# Patient Record
Sex: Female | Born: 1970 | Race: Black or African American | Hispanic: No | Marital: Single | Smoking: Never smoker
Health system: Southern US, Community
[De-identification: ages and names within clinical notes are randomized; demographics above are authoritative.]

## PROBLEM LIST (undated history)

## (undated) HISTORY — PX: ABDOMINAL HYSTERECTOMY: SHX81

---

## 2005-08-07 ENCOUNTER — Emergency Department: Payer: Self-pay | Admitting: Emergency Medicine

## 2007-01-17 ENCOUNTER — Emergency Department: Payer: Self-pay | Admitting: Emergency Medicine

## 2007-01-17 ENCOUNTER — Other Ambulatory Visit: Payer: Self-pay

## 2008-01-28 ENCOUNTER — Emergency Department: Payer: Self-pay | Admitting: Emergency Medicine

## 2008-03-19 ENCOUNTER — Emergency Department: Payer: Self-pay | Admitting: Emergency Medicine

## 2008-12-19 ENCOUNTER — Emergency Department: Payer: Self-pay | Admitting: Emergency Medicine

## 2009-10-30 ENCOUNTER — Emergency Department: Payer: Self-pay | Admitting: Internal Medicine

## 2010-03-22 ENCOUNTER — Emergency Department: Payer: Self-pay | Admitting: Unknown Physician Specialty

## 2014-01-22 ENCOUNTER — Emergency Department: Payer: Self-pay | Admitting: Emergency Medicine

## 2014-01-22 LAB — URINALYSIS, COMPLETE
Bilirubin,UR: NEGATIVE
Glucose,UR: NEGATIVE mg/dL (ref 0–75)
Ketone: NEGATIVE
LEUKOCYTE ESTERASE: NEGATIVE
NITRITE: NEGATIVE
Ph: 6 (ref 4.5–8.0)
RBC,UR: 2141 /HPF (ref 0–5)
Specific Gravity: 1.019 (ref 1.003–1.030)
Squamous Epithelial: 1
WBC UR: 3 /HPF (ref 0–5)

## 2014-01-22 LAB — CBC
HCT: 33 % — AB (ref 35.0–47.0)
HGB: 10.4 g/dL — ABNORMAL LOW (ref 12.0–16.0)
MCH: 23.8 pg — AB (ref 26.0–34.0)
MCHC: 31.6 g/dL — ABNORMAL LOW (ref 32.0–36.0)
MCV: 75 fL — ABNORMAL LOW (ref 80–100)
PLATELETS: 266 10*3/uL (ref 150–440)
RBC: 4.37 10*6/uL (ref 3.80–5.20)
RDW: 17.2 % — ABNORMAL HIGH (ref 11.5–14.5)
WBC: 9.6 10*3/uL (ref 3.6–11.0)

## 2014-01-22 LAB — COMPREHENSIVE METABOLIC PANEL
ALBUMIN: 3.6 g/dL (ref 3.4–5.0)
AST: 19 U/L (ref 15–37)
Alkaline Phosphatase: 82 U/L
Anion Gap: 8 (ref 7–16)
BUN: 17 mg/dL (ref 7–18)
Bilirubin,Total: 0.2 mg/dL (ref 0.2–1.0)
CALCIUM: 9.2 mg/dL (ref 8.5–10.1)
CREATININE: 0.59 mg/dL — AB (ref 0.60–1.30)
Chloride: 107 mmol/L (ref 98–107)
Co2: 25 mmol/L (ref 21–32)
EGFR (African American): >60
EGFR (Non-African Amer.): >60
Glucose: 83 mg/dL (ref 65–99)
Osmolality: 280 (ref 275–301)
POTASSIUM: 4 mmol/L (ref 3.5–5.1)
SGPT (ALT): 23 U/L (ref 12–78)
SODIUM: 140 mmol/L (ref 136–145)
TOTAL PROTEIN: 7.6 g/dL (ref 6.4–8.2)

## 2014-01-22 LAB — HCG, QUANTITATIVE, PREGNANCY

## 2014-01-22 LAB — WET PREP, GENITAL

## 2014-01-23 LAB — GC/CHLAMYDIA PROBE AMP

## 2014-04-15 ENCOUNTER — Ambulatory Visit: Payer: Self-pay | Admitting: Obstetrics and Gynecology

## 2014-04-15 LAB — CBC
HCT: 34.5 % — ABNORMAL LOW (ref 35.0–47.0)
HGB: 11.1 g/dL — ABNORMAL LOW (ref 12.0–16.0)
MCH: 24.5 pg — AB (ref 26.0–34.0)
MCHC: 32.1 g/dL (ref 32.0–36.0)
MCV: 76 fL — AB (ref 80–100)
Platelet: 261 10*3/uL (ref 150–440)
RBC: 4.53 10*6/uL (ref 3.80–5.20)
RDW: 17.7 % — ABNORMAL HIGH (ref 11.5–14.5)
WBC: 7.1 10*3/uL (ref 3.6–11.0)

## 2014-05-11 ENCOUNTER — Inpatient Hospital Stay: Payer: Self-pay | Admitting: Obstetrics and Gynecology

## 2014-05-12 LAB — HEMOGLOBIN: HGB: 10.1 g/dL — AB (ref 12.0–16.0)

## 2014-05-13 LAB — PATHOLOGY REPORT

## 2015-01-16 NOTE — Op Note (Signed)
PATIENT NAME:  Deanna Arellano, Deanna Arellano MR#:  [id number] DATE OF BIRTH:  1970/11/14  DATE OF PROCEDURE:  05/11/2014  PREOPERATIVE DIAGNOSES: 1.  Menorrhagia.  2.  Iron deficiency anemia.  3.  Dysmenorrhea.  4.  Uterine fibroid.   POSTOPERATIVE DIAGNOSES: 1.  Menorrhagia.  2.  Iron deficiency anemia.  3.  Dysmenorrhea.  4.  Uterine fibroid.  5.  Pelvic adhesive disease.   OPERATIVE PROCEDURE: Total abdominal hysterectomy, bilateral salpingo-oophorectomy with adhesiolysis.   SURGEON: Sharon Seller, MD  FIRST ASSISTANT: Dr. Valentino Saxon  ANESTHESIA: General endotracheal.   INDICATIONS: The patient is a 44 year old African American female, para 2-0-1-2, status post BTL for contraception, who presents for surgical management of menorrhagia to anemia and dysmenorrhea. The patient has known uterine fibroid on pelvic ultrasound.   FINDINGS AT SURGERY: Revealed a grossly normal-appearing uterus; small fibroid was identified. Filmy adhesions were noted at the bladder flap as well as is in the left adnexa; these adhesions had to be lysed. The left tube contained a hydrosalpinx. Right ovary was notable for a simple ovarian cyst.   DESCRIPTION OF PROCEDURE: The patient was brought to the operating room where she was placed in the supine position. General endotracheal anesthesia was induced without difficulty. A Betadine and ChloraPrep abdominal, perineal, intravaginal prep and drape was performed in standard fashion. Foley catheter was placed and was draining clear yellow urine. After timeout, the laparotomy was performed with Pfannenstiel incision being made in the lower abdomen. The fascia was incised transversely and extended bilaterally with Mayo scissors. The rectus muscle was dissected off the fascia through sharp and blunt dissection. Midline raphe was identified and separated and the peritoneum was entered. A Balfour retractor was used to facilitate exposure. Wet laps were used to pack off the bowel. The  above-noted findings were identified. Adhesiolysis was performed over the lower uterine segment and in the left adnexal region in order to restore normal anatomy. This was done with Metzenbaum as well as Bovie cautery. Once anatomy was restored, the hysterectomy was performed in standard fashion. Round ligaments were clamped, cut and stick tied using 0 Vicryl suture. The anterior and posterior leafs of the broad ligament were opened. The bladder flap was created over the lower uterine segment through sharp dissection. The utero-ovarian ligaments were isolated and doubly clamped and cut. These were stick tied using 0 Vicryl suture. The cardinal broad ligament complexes were then dissected to expose the uterine arteries. Sequentially, the cardinal broad ligament complexes were clamped, cut and stick tied using 0 Vicryl suture. This procedure was carried out down to the level of the cervicovaginal junction. At this point, the cervix was crossclamped with curved Heaney clamps and the specimen was removed from the operative field. The angles of the vagina were closed using Richardson stitches of 0 Vicryl suture. The intervening vaginal cuff was closed using a 0 Vicryl suture in a figure-of-eight manner. The pelvis was copiously irrigated. The fallopian tubes were then removed with the tubes being cross-clamped with curved Heaney clamp following excision with Metzenbaum scissors. The pedicles were then tied off using 0 Vicryl suture. The left hydrosalpinx had to be mobilized through sharp dissection. This was then crossclamped, cut and stick tied similarly. Good hemostasis was obtained. Further inspection of the pelvis revealed minimal light bleeding at the vaginal cuff. Bovie cautery was used to help facilitate hemostasis. Arista was applied to help also promote hemostasis. Once satisfied with hemostasis control, the procedure was terminated with all instrumentation being removed from the abdominopelvic  cavity. The  incision was closed in layers using 0 Maxon on the fascia in a simple running manner. The skin was closed with a subcuticular stitch of 4-0 Vicryl. Dermabond glue was placed over the incision. Pressure dressing was applied. The patient was then mobilized, extubated and taken to the recovery room in satisfactory condition. Estimated blood loss was 150 mL. IV fluids were 1 liter of crystalloid. Urine output was 500 mL of clear urine. The patient did receive Ancef antibiotic prophylaxis. All instrument, needle, and sponge counts were verified as correct.  ____________________________ Prentice DockerMartin A. Kearston Putman, MD mad:sb D: 05/11/2014 10:00:43 ET T: 05/11/2014 11:05:37 ET JOB#: [id number]  cc: Daphine DeutscherMartin A. Blakeley Scheier, MD, <Dictator> Prentice DockerMARTIN A Timothea Bodenheimer MD ELECTRONICALLY SIGNED 05/17/2014 6:56

## 2016-10-23 ENCOUNTER — Encounter: Payer: Self-pay | Admitting: Emergency Medicine

## 2016-10-23 ENCOUNTER — Emergency Department
Admission: EM | Admit: 2016-10-23 | Discharge: 2016-10-23 | Disposition: A | Discharge status: Home / Self Care | Payer: Self-pay | Attending: Student in an Organized Health Care Education/Training Program | Admitting: Student in an Organized Health Care Education/Training Program

## 2016-10-23 ENCOUNTER — Emergency Department: Payer: Self-pay

## 2016-10-23 DIAGNOSIS — J40 Bronchitis, not specified as acute or chronic: Secondary | ICD-10-CM | POA: Insufficient documentation

## 2016-10-23 LAB — URINALYSIS, COMPLETE (UACMP) WITH MICROSCOPIC
BILIRUBIN URINE: NEGATIVE
Glucose, UA: NEGATIVE mg/dL
HGB URINE DIPSTICK: NEGATIVE
KETONES UR: 5 mg/dL — AB
LEUKOCYTES UA: NEGATIVE
NITRITE: NEGATIVE
Protein, ur: NEGATIVE mg/dL
SPECIFIC GRAVITY, URINE: 1.028 (ref 1.005–1.030)
pH: 5 (ref 5.0–8.0)

## 2016-10-23 LAB — INFLUENZA PANEL BY PCR (TYPE A & B)
INFLAPCR: NEGATIVE
Influenza B By PCR: NEGATIVE

## 2016-10-23 MED ORDER — PREDNISONE 10 MG (21) PO TBPK: ORAL_TABLET | ORAL | 0 refills | Status: DC

## 2016-10-23 MED ORDER — IBUPROFEN 600 MG PO TABS
600.0000 mg | ORAL_TABLET | Freq: Once | ORAL | Status: AC
  Administered 2016-10-23: 600 mg via ORAL

## 2016-10-23 MED ORDER — AZITHROMYCIN 250 MG PO TABS: ORAL_TABLET | ORAL | 0 refills | Status: DC

## 2016-10-23 MED ORDER — IBUPROFEN 400 MG PO TABS
ORAL_TABLET | ORAL | Status: AC
  Administered 2016-10-23: 600 mg via ORAL
  Filled 2016-10-23: qty 2

## 2016-10-23 NOTE — ED Triage Notes (Signed)
Cough, fever and body aches x 1 week.

## 2016-10-23 NOTE — ED Provider Notes (Signed)
[facility] Emergency Department Provider Note  ____________________________________________  Time seen: Approximately 5:29 PM  I have reviewed the triage vital signs and the nursing notes.   HISTORY  Chief Complaint Cough    HPI Deanna Arellano is a 46 y.o. female emergency department with one week of muscle aches, productive cough with clear sputum. Patient states that she coughed so much that she urinates. Patient is unsure of fever. Patient has not taken anything for symptoms. Patient did not receive flu shot this year.Patient denies headache, sinus congestion, sore throat, shortness of breath, chest pain, nausea, vomiting.   History reviewed. No pertinent past medical history.  There are no active problems to display for this patient.   Past Surgical History:  Procedure Laterality Date  . ABDOMINAL HYSTERECTOMY      Prior to Admission medications   Medication Sig Start Date End Date Taking? Authorizing Provider  azithromycin (ZITHROMAX Z-PAK) 250 MG tablet Take 2 tablets (500 mg) on  Day 1,  followed by 1 tablet (250 mg) once daily on Days 2 through 5. 10/23/16   Enid Derry, PA-C  predniSONE (STERAPRED UNI-PAK 21 TAB) 10 MG (21) TBPK tablet Take 6 tablets on day 1, take 5 tablets on day 2, take 4 tablets on day 3, take 3 tablets on day 4, take 2 tablets on day 5, take 1 tablet on day 6 10/23/16   Enid Derry, PA-C    Allergies Amoxicillin  No family history on file.  Social History Social History  Substance Use Topics  . Smoking status: Never Smoker  . Smokeless tobacco: Not on file  . Alcohol use Not on file     Review of Systems  Constitutional: No chills Eyes: No visual changes. No discharge. ENT: Negative for congestion and rhinorrhea. Cardiovascular: No chest pain. Respiratory: Positive for cough. No SOB. Gastrointestinal:  No nausea, no vomiting.  No diarrhea.  No constipation. Musculoskeletal: Negative for musculoskeletal  pain. Skin: Negative for rash, abrasions, lacerations, ecchymosis. Neurological: Negative for headaches.   ____________________________________________   PHYSICAL EXAM:  VITAL SIGNS: ED Triage Vitals  Enc Vitals Group     BP 10/23/16 1557 (!) 146/96     Pulse Rate 10/23/16 1557 (!) 112     Resp 10/23/16 1557 20     Temp 10/23/16 1557 (!) 101.6 F (38.7 C)     Temp Source 10/23/16 1557 Oral     SpO2 10/23/16 1557 97 %     Weight 10/23/16 1558 210 lb (95.3 kg)     Height 10/23/16 1558 5\' 7"  (1.702 m)     Head Circumference --      Peak Flow --      Pain Score 10/23/16 1558 10     Pain Loc --      Pain Edu? --      Excl. in GC? --      Constitutional: Alert and oriented. Well appearing and in no acute distress. Eyes: Conjunctivae are normal. PERRL. EOMI. No discharge. Head: Atraumatic. ENT: No frontal and maxillary sinus tenderness.      Ears: Tympanic membranes pearly gray with good landmarks. No discharge.      Nose: Mild congestion/rhinnorhea.      Mouth/Throat: Mucous membranes are moist. Oropharynx non-erythematous. Tonsils not enlarged. No exudates. Uvula midline. Neck: No stridor.   Hematological/Lymphatic/Immunilogical: No cervical lymphadenopathy. Cardiovascular: Normal rate, regular rhythm. Good peripheral circulation. Respiratory: Normal respiratory effort without tachypnea or retractions. Lungs CTAB. Good air entry to the bases  with no decreased or absent breath sounds. Gastrointestinal: Bowel sounds 4 quadrants. Soft and nontender to palpation. No guarding or rigidity. No palpable masses. No distention. Musculoskeletal: Full range of motion to all extremities. No gross deformities appreciated. Neurologic:  Normal speech and language. No gross focal neurologic deficits are appreciated.  Skin:  Skin is warm, dry and intact. No rash noted. Psychiatric: Mood and affect are normal. Speech and behavior are normal. Patient exhibits appropriate insight and  judgement.   ____________________________________________   LABS (all labs ordered are listed, but only abnormal results are displayed)  Labs Reviewed  URINALYSIS, COMPLETE (UACMP) WITH MICROSCOPIC - Abnormal; Notable for the following:       Result Value   Color, Urine YELLOW (*)    APPearance HAZY (*)    Ketones, ur 5 (*)    Bacteria, UA RARE (*)    Squamous Epithelial / LPF 6-30 (*)    All other components within normal limits  INFLUENZA PANEL BY PCR (TYPE A & B)   ____________________________________________  EKG   ____________________________________________  RADIOLOGY Lexine BatonI, Anmol Fleck, personally viewed and evaluated these images (plain radiographs) as part of my medical decision making, as well as reviewing the written report by the radiologist.  Dg Chest 2 View  Result Date: 10/23/2016 CLINICAL DATA:  46 year old female with history of cough, congestion and generalized chest pain. Urinary incontinence for 1 week. EXAM: CHEST  2 VIEW COMPARISON:  Chest x-ray 01/17/2007. FINDINGS: Lung volumes are normal. No consolidative airspace disease. No pleural effusions. No pneumothorax. No pulmonary nodule or mass noted. Pulmonary vasculature and the cardiomediastinal silhouette are within normal limits. IMPRESSION: No radiographic evidence of acute cardiopulmonary disease. Electronically Signed   By: Trudie Reedaniel  Entrikin M.D.   On: 10/23/2016 16:53    ____________________________________________    PROCEDURES  Procedure(s) performed:    Procedures    Medications  ibuprofen (ADVIL,MOTRIN) tablet 600 mg (600 mg Oral Given 10/23/16 1603)     ____________________________________________   INITIAL IMPRESSION / ASSESSMENT AND PLAN / ED COURSE  Pertinent labs & imaging results that were available during my care of the patient were reviewed by me and considered in my medical decision making (see chart for details).  Review of the [place] CSRS was performed in accordance of the  [facility] prior to dispensing any controlled drugs.     Patient's diagnosis is consistent with Bronchitis. Exam is reassuring. Patient's temperature came down to 98.3 before discharge. Heart rate reassessed at 90 bpm. No indication of acute cardiopulmonary processes on x-ray. Influenza negative. No indication of UTI, as rare bacteria present on urinalysis was likely contamination. Patient will be discharged home with prescriptions for azithromycin and prednisone. Patient is to follow up with PCP as needed or otherwise directed. Patient is given ED precautions to return to the ED for any worsening or new symptoms.   ____________________________________________  FINAL CLINICAL IMPRESSION(S) / ED DIAGNOSES  Final diagnoses:  Bronchitis      NEW MEDICATIONS STARTED DURING THIS VISIT:  Discharge Medication List as of 10/23/2016  5:48 PM    START taking these medications   Details  azithromycin (ZITHROMAX Z-PAK) 250 MG tablet Take 2 tablets (500 mg) on  Day 1,  followed by 1 tablet (250 mg) once daily on Days 2 through 5., Print    predniSONE (STERAPRED UNI-PAK 21 TAB) 10 MG (21) TBPK tablet Take 6 tablets on day 1, take 5 tablets on day 2, take 4 tablets on day 3, take 3 tablets on  day 4, take 2 tablets on day 5, take 1 tablet on day 6, Print            This chart was dictated using voice recognition software/Dragon. Despite best efforts to proofread, errors can occur which can change the meaning. Any change was purely unintentional.    Enid Derry, PA-C 10/23/16 1842    Willy Eddy, MD 10/23/16 2019

## 2017-10-25 ENCOUNTER — Other Ambulatory Visit: Payer: Self-pay

## 2017-10-25 ENCOUNTER — Emergency Department
Admission: EM | Admit: 2017-10-25 | Discharge: 2017-10-25 | Disposition: A | Discharge status: Home / Self Care | Payer: Self-pay | Attending: Emergency Medicine | Admitting: Emergency Medicine

## 2017-10-25 DIAGNOSIS — B349 Viral infection, unspecified: Secondary | ICD-10-CM | POA: Insufficient documentation

## 2017-10-25 LAB — INFLUENZA PANEL BY PCR (TYPE A & B)
INFLAPCR: NEGATIVE
Influenza B By PCR: NEGATIVE

## 2017-10-25 LAB — GROUP A STREP BY PCR: Group A Strep by PCR: NOT DETECTED

## 2017-10-25 MED ORDER — MAGIC MOUTHWASH W/LIDOCAINE: 5.0000 mL | Freq: Four times a day (QID) | ORAL | 0 refills | Status: DC

## 2017-10-25 MED ORDER — IBUPROFEN 600 MG PO TABS: 600.0000 mg | ORAL_TABLET | Freq: Three times a day (TID) | ORAL | 0 refills | Status: DC | PRN

## 2017-10-25 MED ORDER — PSEUDOEPH-BROMPHEN-DM 30-2-10 MG/5ML PO SYRP: 5.0000 mL | ORAL_SOLUTION | Freq: Four times a day (QID) | ORAL | 0 refills | Status: DC | PRN

## 2017-10-25 MED ORDER — KETOROLAC TROMETHAMINE 60 MG/2ML IM SOLN
60.0000 mg | Freq: Once | INTRAMUSCULAR | Status: AC
  Administered 2017-10-25: 60 mg via INTRAMUSCULAR
  Filled 2017-10-25: qty 2

## 2017-10-25 NOTE — ED Notes (Signed)
See triage note.  States she developed chills and body aches for about 1 week  Afebrile on arrival.states she has been taking OTC meds w/o relief

## 2017-10-25 NOTE — ED Triage Notes (Signed)
Pt report body aches, denies N/V, c/o sore throat, runny nose, post nasal drip - pt has been sick for 1 week

## 2017-10-25 NOTE — ED Provider Notes (Signed)
[facility] Emergency Department Provider Note   ____________________________________________   First MD Initiated Contact with Patient 10/25/17 1453     (approximate)  I have reviewed the triage vital signs and the nursing notes.   HISTORY  Chief Complaint URI    HPI Deanna Arellano is a 47 y.o. female patient complained of body aches, sore throat, intermittent nasal congestion and runny nose and a postnasal drainage.  Patient denies nausea, vomiting, or diarrhea.  Patient complaint for 1 week but is increased in the past 2 days.  No relief with over-the-counter preparations.  Patient described body aches as "generalized aching". History reviewed. No pertinent past medical history.  There are no active problems to display for this patient.   Past Surgical History:  Procedure Laterality Date  . ABDOMINAL HYSTERECTOMY      Prior to Admission medications   Medication Sig Start Date End Date Taking? Authorizing Provider  azithromycin (ZITHROMAX Z-PAK) 250 MG tablet Take 2 tablets (500 mg) on  Day 1,  followed by 1 tablet (250 mg) once daily on Days 2 through 5. 10/23/16   Enid Derry, PA-C  brompheniramine-pseudoephedrine-DM 30-2-10 MG/5ML syrup Take 5 mLs by mouth 4 (four) times daily as needed. 10/25/17   Joni Reining, PA-C  ibuprofen (ADVIL,MOTRIN) 600 MG tablet Take 1 tablet (600 mg total) by mouth every 8 (eight) hours as needed. 10/25/17   Joni Reining, PA-C  magic mouthwash w/lidocaine SOLN Take 5 mLs by mouth 4 (four) times daily. 10/25/17   Joni Reining, PA-C  predniSONE (STERAPRED UNI-PAK 21 TAB) 10 MG (21) TBPK tablet Take 6 tablets on day 1, take 5 tablets on day 2, take 4 tablets on day 3, take 3 tablets on day 4, take 2 tablets on day 5, take 1 tablet on day 6 10/23/16   Enid Derry, PA-C    Allergies Amoxicillin  No family history on file.  Social History Social History   Tobacco Use  . Smoking status: Never Smoker  .  Smokeless tobacco: Never Used  Substance Use Topics  . Alcohol use: Yes    Comment: occ  . Drug use: No    Review of Systems Constitutional: No fever/chills Eyes: No visual changes. ENT: Sore throat and runny nose Cardiovascular: Denies chest pain. Respiratory: Denies shortness of breath. Gastrointestinal: No abdominal pain.  No nausea, no vomiting.  No diarrhea.  No constipation. Genitourinary: Negative for dysuria. Musculoskeletal: Negative for back pain. Skin: Negative for rash. Neurological: Negative for headaches, focal weakness or numbness. Allergic/Immunilogical: Penicillin ____________________________________________   PHYSICAL EXAM:  VITAL SIGNS: ED Triage Vitals  Enc Vitals Group     BP 10/25/17 1428 130/81     Pulse Rate 10/25/17 1428 64     Resp 10/25/17 1428 16     Temp 10/25/17 1428 97.8 F (36.6 C)     Temp Source 10/25/17 1428 Oral     SpO2 10/25/17 1428 100 %     Weight 10/25/17 1429 250 lb (113.4 kg)     Height 10/25/17 1429 5\' 7"  (1.702 m)     Head Circumference --      Peak Flow --      Pain Score 10/25/17 1429 6     Pain Loc --      Pain Edu? --      Excl. in GC? --     Constitutional: Alert and oriented. Well appearing and in no acute distress. Nose: Edematous nasal turbinates clear rhinorrhea  Mouth/Throat: Mucous membranes are moist.  Oropharynx erythematous.  No exudate Neck: No stridor. Hematological/Lymphatic/Immunilogical: No cervical lymphadenopathy. Cardiovascular: Normal rate, regular rhythm. Grossly normal heart sounds.  Good peripheral circulation. Respiratory: Normal respiratory effort.  No retractions. Lungs CTAB. Neurologic:  Normal speech and language. No gross focal neurologic deficits are appreciated. No gait instability. Skin:  Skin is warm, dry and intact. No rash noted. Psychiatric: Mood and affect are normal. Speech and behavior are normal.  ____________________________________________   LABS (all labs ordered are  listed, but only abnormal results are displayed)  Labs Reviewed  GROUP A STREP BY PCR  INFLUENZA PANEL BY PCR (TYPE A & B)   ____________________________________________  EKG   ____________________________________________  RADIOLOGY  ED MD interpretation:    Official radiology report(s): No results found.  ____________________________________________   PROCEDURES  Procedure(s) performed: None  Procedures  Critical Care performed: No  ____________________________________________   INITIAL IMPRESSION / ASSESSMENT AND PLAN / ED COURSE  As part of my medical decision making, I reviewed the following data within the electronic MEDICAL RECORD NUMBER    Viral illness.  Discussed negative strep and flu results with patient.  Patient given discharge care instructions.  Patient with a work note advised take medication as directed.  Patient [facility].      ____________________________________________   FINAL CLINICAL IMPRESSION(S) / ED DIAGNOSES  Final diagnoses:  Viral syndrome     ED Discharge Orders        Ordered    brompheniramine-pseudoephedrine-DM 30-2-10 MG/5ML syrup  4 times daily PRN     10/25/17 1617    ibuprofen (ADVIL,MOTRIN) 600 MG tablet  Every 8 hours PRN     10/25/17 1617    magic mouthwash w/lidocaine SOLN  4 times daily     10/25/17 1617       Note:  This document was prepared using Dragon voice recognition software and may include unintentional dictation errors.    Joni ReiningSmith, Chaquana Nichols K, PA-C 10/25/17 1622    Phineas SemenGoodman, [place], MD 10/25/17 [phone]

## 2018-05-21 ENCOUNTER — Other Ambulatory Visit: Payer: Self-pay

## 2018-05-21 ENCOUNTER — Ambulatory Visit
Admission: EM | Admit: 2018-05-21 | Discharge: 2018-05-21 | Disposition: A | Discharge status: Home / Self Care | Payer: Self-pay | Attending: Family Medicine | Admitting: Family Medicine

## 2018-05-21 ENCOUNTER — Encounter: Payer: Self-pay | Admitting: Emergency Medicine

## 2018-05-21 DIAGNOSIS — R8290 Unspecified abnormal findings in urine: Secondary | ICD-10-CM

## 2018-05-21 DIAGNOSIS — R1030 Lower abdominal pain, unspecified: Secondary | ICD-10-CM

## 2018-05-21 LAB — URINALYSIS, COMPLETE (UACMP) WITH MICROSCOPIC
BILIRUBIN URINE: NEGATIVE
Glucose, UA: NEGATIVE mg/dL
HGB URINE DIPSTICK: NEGATIVE
Leukocytes, UA: NEGATIVE
NITRITE: NEGATIVE
RBC / HPF: NONE SEEN RBC/hpf (ref 0–5)
Specific Gravity, Urine: 1.015 (ref 1.005–1.030)
pH: 8.5 — ABNORMAL HIGH (ref 5.0–8.0)

## 2018-05-21 MED ORDER — ONDANSETRON HCL 4 MG PO TABS: 4.0000 mg | ORAL_TABLET | Freq: Three times a day (TID) | ORAL | 0 refills | Status: DC | PRN

## 2018-05-21 NOTE — ED Triage Notes (Signed)
Patient c/o dysuria that started over 1 week ago.

## 2018-05-21 NOTE — Discharge Instructions (Signed)
Stay hydrated. Don't hold urine.   Zofran as needed for nausea.  Take care  Dr. Adriana Simasook

## 2018-05-21 NOTE — ED Provider Notes (Signed)
[facility]    CSN: [id number] Arrival date & time: 05/21/18  1627  History   Chief Complaint Chief Complaint  Patient presents with  . Dysuria   HPI  47 year old female presents with concerns that she has UTI.  Patient states that she has had ongoing urinary symptoms for the past 3 months.  Patient reported to the nurse that she had dysuria but denies dysuria with me.  She states that she has some suprapubic discomfort after she urinates.  But no pain when she actually urinates.  No frequency.  Patient often holds her urine while at work.  Denies vaginal discharge.  No concern for UTI.  Patient has had a hysterectomy and is therefore not pregnant.  Patient reports that she has an odor to her urine.  Patient states that it is strong and requires her to spray ear pressure.  No fevers or chills.  No flank pain.  Patient does report that she has had some nausea as of yesterday.  No other associated symptoms.  No other complaints.  PMH, Surgical Hx, Family Hx, Social History reviewed and updated as below.  PMH: Reported Hx of UTI  Past Surgical History:  Procedure Laterality Date  . ABDOMINAL HYSTERECTOMY      OB History   None    Home Medications    Prior to Admission medications   Medication Sig Start Date End Date Taking? Authorizing Provider  ondansetron (ZOFRAN) 4 MG tablet Take 1 tablet (4 mg total) by mouth every 8 (eight) hours as needed for nausea or vomiting. 05/21/18   Tommie Samsook, Tim Corriher G, DO   Family History Family History  Problem Relation Age of Onset  . Asthma Mother   . Hypertension Father    Social History Social History   Tobacco Use  . Smoking status: Never Smoker  . Smokeless tobacco: Never Used  Substance Use Topics  . Alcohol use: Yes    Comment: occ  . Drug use: No   Allergies   Amoxicillin  Review of Systems Review of Systems  Constitutional: Negative.   Gastrointestinal:       Suprapubic pain.   Genitourinary: Negative for  frequency.       Odor.    Physical Exam Triage Vital Signs ED Triage Vitals  Enc Vitals Group     BP 05/21/18 1644 140/77     Pulse Rate 05/21/18 1644 73     Resp 05/21/18 1644 18     Temp 05/21/18 1644 98.1 F (36.7 C)     Temp Source 05/21/18 1644 Oral     SpO2 05/21/18 1644 98 %     Weight 05/21/18 1642 210 lb (95.3 kg)     Height 05/21/18 1642 5\' 7"  (1.702 m)     Head Circumference --      Peak Flow --      Pain Score 05/21/18 1642 0     Pain Loc --      Pain Edu? --      Excl. in GC? --    Updated Vital Signs BP 140/77 (BP Location: Left Arm)   Pulse 73   Temp 98.1 F (36.7 C) (Oral)   Resp 18   Ht 5\' 7"  (1.702 m)   Wt 95.3 kg   SpO2 98%   BMI 32.89 kg/m   Visual Acuity Right Eye Distance:   Left Eye Distance:   Bilateral Distance:    Right Eye Near:   Left Eye Near:    Bilateral  Near:     Physical Exam  Constitutional: She is oriented to person, place, and time. She appears well-developed. No distress.  Cardiovascular: Normal rate and regular rhythm.  Pulmonary/Chest: Effort normal and breath sounds normal.  Abdominal: Soft. She exhibits no distension. There is no tenderness.  Neurological: She is alert and oriented to person, place, and time.  Psychiatric: She has a normal mood and affect. Her behavior is normal.  Nursing note and vitals reviewed.  UC Treatments / Results  Labs (all labs ordered are listed, but only abnormal results are displayed) Labs Reviewed  URINALYSIS, COMPLETE (UACMP) WITH MICROSCOPIC - Abnormal; Notable for the following components:      Result Value   pH 8.5 (*)    Ketones, ur TRACE (*)    Protein, ur TRACE (*)    Bacteria, UA RARE (*)    All other components within normal limits  URINE CULTURE    EKG None  Radiology No results found.  Procedures Procedures (including critical care time)  Medications Ordered in UC Medications - No data to display  Initial Impression / Assessment and Plan / UC Course  I  have reviewed the triage vital signs and the nursing notes.  Pertinent labs & imaging results that were available during my care of the patient were reviewed by me and considered in my medical decision making (see chart for details).    47 year old female presents with urine odor and mild suprapubic pain.  No dysuria.  No frequency.  Urinalysis not consistent with UTI.  Sending culture.  Supportive care.  Zofran as needed for nausea.  Final Clinical Impressions(s) / UC Diagnoses   Final diagnoses:  Abnormal urine odor     Discharge Instructions     Stay hydrated. Don't hold urine.   Zofran as needed for nausea.  Take care  Dr. Adriana Simas    ED Prescriptions    Medication Sig Dispense Auth. Provider   ondansetron (ZOFRAN) 4 MG tablet Take 1 tablet (4 mg total) by mouth every 8 (eight) hours as needed for nausea or vomiting. 20 tablet Tommie Sams, DO     Controlled Substance Prescriptions [place] Controlled Substance Registry consulted? Not Applicable   Tommie Sams, DO 05/21/18 1839

## 2018-05-24 LAB — URINE CULTURE

## 2018-05-27 ENCOUNTER — Telehealth (HOSPITAL_COMMUNITY): Payer: Self-pay

## 2018-05-27 MED ORDER — SULFAMETHOXAZOLE-TRIMETHOPRIM 800-160 MG PO TABS: 1.0000 | ORAL_TABLET | Freq: Two times a day (BID) | ORAL | 0 refills | Status: AC

## 2018-05-27 MED ORDER — FLUCONAZOLE 150 MG PO TABS: 150.0000 mg | ORAL_TABLET | Freq: Every day | ORAL | 0 refills | Status: AC

## 2018-05-27 NOTE — Telephone Encounter (Signed)
Pt called concern for yeast infection from antibiotics. Diflucan 150 mg called into pharmacy of choice.

## 2018-10-03 ENCOUNTER — Other Ambulatory Visit: Payer: Self-pay

## 2018-10-03 ENCOUNTER — Encounter: Payer: Self-pay | Admitting: Emergency Medicine

## 2018-10-03 ENCOUNTER — Ambulatory Visit
Admission: EM | Admit: 2018-10-03 | Discharge: 2018-10-03 | Disposition: A | Discharge status: Home / Self Care | Payer: Self-pay | Attending: Emergency Medicine | Admitting: Emergency Medicine

## 2018-10-03 DIAGNOSIS — J0101 Acute recurrent maxillary sinusitis: Secondary | ICD-10-CM | POA: Insufficient documentation

## 2018-10-03 MED ORDER — DOXYCYCLINE HYCLATE 100 MG PO CAPS: 100.0000 mg | ORAL_CAPSULE | Freq: Two times a day (BID) | ORAL | 0 refills | Status: DC

## 2018-10-03 MED ORDER — FLUTICASONE PROPIONATE 50 MCG/ACT NA SUSP: 1.0000 | Freq: Every day | NASAL | 0 refills | Status: DC

## 2018-10-03 NOTE — Discharge Instructions (Addendum)
Take medication as prescribed. Rest. Drink plenty of fluids. Over the counter mucinex as discussed.  ° °Follow up with your primary care physician this week as needed. Return to Urgent care for new or worsening concerns.  ° °

## 2018-10-03 NOTE — ED Triage Notes (Signed)
Patient c/o nasal congestion x 1 week. Patient states she has a sinus infection. Denies fever. Denies cough. Patient has been taking OTC sinus medication for her symptoms.

## 2018-10-03 NOTE — ED Provider Notes (Signed)
[facility] ____________________________________________  Time seen: Approximately 12:31 PM  I have reviewed the triage vital signs and the nursing notes.   HISTORY  Chief Complaint Nasal Congestion   HPI Deanna Arellano is a 48 y.o. female presenting for evaluation of just over 1 week of nasal congestion, sinus pressure and sinus drainage.  Reports intermittent cough, stating cough is worse at night.  States sinus pressure has been constant for the last few days around her nose and her cheeks.  States thick nasal drainage when blowing her nose as well as postnasal.  States mild sinus pain currently.  Denies sore throat, known fevers or recent sickness.  Denies any current chest pain or shortness of breath.  Has continue to remain active.  Continues to eat and drink well.  Has tried over-the-counter cough and congestion medication without resolution.  Patient states that this feels similar to her past recurrent sinus infections.  Denies recent sinusitis.  Has not been seen by ENT in the past.  No LMP recorded. Patient has had a hysterectomy.   History reviewed. No pertinent past medical history.  There are no active problems to display for this patient.   Past Surgical History:  Procedure Laterality Date  . ABDOMINAL HYSTERECTOMY       No current facility-administered medications for this encounter.   Current Outpatient Medications:  .  doxycycline (VIBRAMYCIN) 100 MG capsule, Take 1 capsule (100 mg total) by mouth 2 (two) times daily., Disp: 20 capsule, Rfl: 0 .  fluticasone (FLONASE) 50 MCG/ACT nasal spray, Place 1 spray into both nostrils daily., Disp: 1 g, Rfl: 0  Allergies Amoxicillin  Family History  Problem Relation Age of Onset  . Asthma Mother   . Hypertension Father     Social History Social History   Tobacco Use  . Smoking status: Never Smoker  . Smokeless tobacco: Never Used  Substance Use Topics  . Alcohol use: Yes    Comment: occ  .  Drug use: No    Review of Systems Constitutional: No fever ENT: No sore throat. As above.  Cardiovascular: Denies chest pain. Respiratory: Denies shortness of breath. Gastrointestinal: No abdominal pain.  No nausea, no vomiting.  No diarrhea.  No constipation. Genitourinary: Negative for dysuria. Musculoskeletal: Negative for back pain. Skin: Negative for rash.   ____________________________________________   PHYSICAL EXAM:  VITAL SIGNS: ED Triage Vitals  Enc Vitals Group     BP 10/03/18 1158 138/79     Pulse Rate 10/03/18 1158 71     Resp 10/03/18 1158 18     Temp 10/03/18 1158 97.9 F (36.6 C)     Temp Source 10/03/18 1158 Oral     SpO2 10/03/18 1158 100 %     Weight 10/03/18 1156 210 lb (95.3 kg)     Height 10/03/18 1156 5\' 7"  (1.702 m)     Head Circumference --      Peak Flow --      Pain Score 10/03/18 1156 0     Pain Loc --      Pain Edu? --      Excl. in GC? --     Constitutional: Alert and oriented. Well appearing and in no acute distress. Eyes: Conjunctivae are normal.  Head: Atraumatic.Mild tenderness to palpation bilateral maxillary sinuses. Nontender frontal sinuses. No swelling. No erythema.   Ears: no erythema, normal TMs bilaterally.   Nose: nasal congestion with bilateral nasal turbinate erythema and edema.   Mouth/Throat: Mucous membranes are moist.  Oropharynx non-erythematous.No tonsillar swelling or exudate.  Post pharyngeal drainage noted. Neck: No stridor.  No cervical spine tenderness to palpation. Hematological/Lymphatic/Immunilogical: No cervical lymphadenopathy. Cardiovascular: Normal rate, regular rhythm. Grossly normal heart sounds.  Good peripheral circulation. Respiratory: Normal respiratory effort.  No retractions. No wheezes, rales or rhonchi. Good air movement.  Musculoskeletal: Steady gait. Neurologic:  Normal speech and language.  No gait instability. Skin:  Skin is warm, dry and intact. No rash noted. Psychiatric: Mood and affect  are normal. Speech and behavior are normal.  ___________________________________________   LABS (all labs ordered are listed, but only abnormal results are displayed)  Labs Reviewed - No data to display ____________________________________________   PROCEDURES Procedures    INITIAL IMPRESSION / ASSESSMENT AND PLAN / ED COURSE  Pertinent labs & imaging results that were available during my care of the patient were reviewed by me and considered in my medical decision making (see chart for details).  Well appearing patient.  No acute distress.  Suspect recent viral upper respiratory infection.  Suspect secondary sinusitis.  Will treat with oral doxycycline and Flonase.  Patient states that she usually has 2-3 sinus infections a year, recommend for patient to follow-up with ENT for further evaluation to recurrence.  Discussed continuing use of Flonase.  Over-the-counter Mucinex as needed. Discussed indication, risks and benefits of medications with patient.  Discussed follow up with Primary care physician this week. Discussed follow up and return parameters including no resolution or any worsening concerns. Patient verbalized understanding and agreed to plan.   ____________________________________________   FINAL CLINICAL IMPRESSION(S) / ED DIAGNOSES  Final diagnoses:  Acute recurrent maxillary sinusitis     ED Discharge Orders         Ordered    doxycycline (VIBRAMYCIN) 100 MG capsule  2 times daily     10/03/18 1217    fluticasone (FLONASE) 50 MCG/ACT nasal spray  Daily     10/03/18 1217           Note: This dictation was prepared with Dragon dictation along with smaller phrase technology. Any transcriptional errors that result from this process are unintentional.         Renford DillsMiller, Keontre Defino, NP 10/03/18 1247

## 2020-04-01 ENCOUNTER — Emergency Department: Payer: Medicaid Other

## 2020-04-01 ENCOUNTER — Emergency Department
Admission: EM | Admit: 2020-04-01 | Discharge: 2020-04-01 | Disposition: A | Discharge status: Home / Self Care | Payer: Medicaid Other | Attending: Emergency Medicine | Admitting: Emergency Medicine

## 2020-04-01 ENCOUNTER — Other Ambulatory Visit: Payer: Self-pay

## 2020-04-01 ENCOUNTER — Encounter: Payer: Self-pay | Admitting: *Deleted

## 2020-04-01 DIAGNOSIS — M6281 Muscle weakness (generalized): Secondary | ICD-10-CM | POA: Insufficient documentation

## 2020-04-01 DIAGNOSIS — M25562 Pain in left knee: Secondary | ICD-10-CM | POA: Insufficient documentation

## 2020-04-01 DIAGNOSIS — M25561 Pain in right knee: Secondary | ICD-10-CM

## 2020-04-01 DIAGNOSIS — R29898 Other symptoms and signs involving the musculoskeletal system: Secondary | ICD-10-CM

## 2020-04-01 MED ORDER — DEXAMETHASONE SODIUM PHOSPHATE 10 MG/ML IJ SOLN
10.0000 mg | Freq: Once | INTRAMUSCULAR | Status: AC
  Administered 2020-04-01: 10 mg via INTRAMUSCULAR
  Filled 2020-04-01: qty 1

## 2020-04-01 MED ORDER — NAPROXEN 500 MG PO TABS: 500.0000 mg | ORAL_TABLET | Freq: Two times a day (BID) | ORAL | 0 refills | Status: DC

## 2020-04-01 NOTE — ED Notes (Signed)
E-signature not working at this time. Pt verbalized understanding of D/C instructions, prescriptions and follow up care with no further questions at this time. Pt in NAD and ambulatory at time of D/C.  

## 2020-04-01 NOTE — ED Triage Notes (Signed)
Pt to triage via wheelchair.  Pt reports pain in both knees.  No known injury.  Pt states it is hard to get up from a sitting position.  Pt alert  Speech clear.

## 2020-04-01 NOTE — Discharge Instructions (Addendum)
You were seen today for bilateral knee pain and weakness.  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  We gave you a steroid shot today.  I am given you prescription for anti-inflammatories to take twice daily for the next 5 days, consume with food.  You may apply ice to help with pain and inflammation.  I have given you knee exercises to help stretch and strengthen the knees.  Please follow-up if pain or weakness persist or worsen.

## 2020-04-01 NOTE — ED Provider Notes (Signed)
[facility] Emergency Department Provider Note ____________________________________________  Time seen: 2055  I have reviewed the triage vital signs and the nursing notes.  HISTORY  Chief Complaint  Knee Pain   HPI Deanna Arellano is a 49 y.o. female presents to the ER today with complaint of bilateral knee pain and weakness.  She reports this started 5 days ago but has progressively gotten worse throughout the course of the week.  She describes the pain as sore and achy.  The pain does not radiate.  She denies numbness, tingling of the lower extremities but has had weakness.  She denies back or hip pain.  She denies loss of bowel or bladder control.  She denies any injury to the area that she is aware of.  She has not taken any medications PTA.  No past medical history on file.  There are no problems to display for this patient.   Past Surgical History:  Procedure Laterality Date  . ABDOMINAL HYSTERECTOMY      Prior to Admission medications   Medication Sig Start Date End Date Taking? Authorizing Provider  doxycycline (VIBRAMYCIN) 100 MG capsule Take 1 capsule (100 mg total) by mouth 2 (two) times daily. 10/03/18   Renford Dills, NP  fluticasone (FLONASE) 50 MCG/ACT nasal spray Place 1 spray into both nostrils daily. 10/03/18   Renford Dills, NP  naproxen (NAPROSYN) 500 MG tablet Take 1 tablet (500 mg total) by mouth 2 (two) times daily with a meal. 04/01/20 04/01/21  Lorre Munroe, NP    Allergies Amoxicillin  Family History  Problem Relation Age of Onset  . Asthma Mother   . Hypertension Father     Social History Social History   Tobacco Use  . Smoking status: Never Smoker  . Smokeless tobacco: Never Used  Vaping Use  . Vaping Use: Never used  Substance Use Topics  . Alcohol use: Yes    Comment: occ  . Drug use: No    Review of Systems  Constitutional: Negative for fever, chills or body aches. Cardiovascular: Negative for chest pain or  chest tightness. Respiratory: Negative for cough or shortness of breath. Gastrointestinal: Negative for loss of bowel control. Genitourinary: Negative for loss of bladder control. Musculoskeletal: Positive for bilateral knee pain and weakness.  Negative for back, hip or ankle pain. Skin: Negative for rash, abrasion or bruising. Neurological: Positive for weakness.  Negative for tingling or numbness. ____________________________________________  PHYSICAL EXAM:  VITAL SIGNS: ED Triage Vitals  Enc Vitals Group     BP 04/01/20 2028 138/69     Pulse Rate 04/01/20 2028 79     Resp 04/01/20 2028 18     Temp 04/01/20 2028 98.3 F (36.8 C)     Temp Source 04/01/20 2028 Oral     SpO2 04/01/20 2028 100 %     Weight 04/01/20 2026 230 lb (104.3 kg)     Height 04/01/20 2026 5\' 7"  (1.702 m)     Head Circumference --      Peak Flow --      Pain Score 04/01/20 2026 8     Pain Loc --      Pain Edu? --      Excl. in GC? --     Constitutional: Alert and oriented.  Obese, in no distress. Head: Normocephalic and atraumatic. Cardiovascular: Normal rate, regular rhythm.  Pedal pulses 2+ bilaterally Respiratory: Normal respiratory effort. No wheezes/rales/rhonchi. Musculoskeletal: Normal flexion, extension and rotation of the spine.  No  bony tenderness noted over the spine.  Normal internal and external rotation of bilateral hips.  No pain with palpation of bilateral hips.  Normal flexion and extension of bilateral knees.  No pain with palpation of bilateral knees.  She has no difficulty getting from a sitting to a standing position. Able to walk without limping. Able to stand on heels and tiptoes. Strength 5/5 BUE/BLE. Neurologic:  Normal gait without ataxia. Normal speech and language. No gross focal neurologic deficits are appreciated. Skin:  Skin is warm, dry and intact. No redness or warmth noted. ____________________________________________   RADIOLOGY  Imaging Orders     DG Knee Complete 4  Views Left     DG Knee Complete 4 Views Right IMPRESSION: Mild tricompartment arthritis with small to moderate knee effusion.  IMPRESSION: Mild tricompartment arthritis of the knee. Probable small effusion.   ____________________________________________    INITIAL IMPRESSION / ASSESSMENT AND PLAN / ED COURSE  Bilateral Knee Pain and Weakness:  Xray bilateral knees c/w tricompartmental arthritis, mild Decadron 10 mg IM x 1 RX for Naproxen 500 mg BID x 5 days Encouraged ice Knee exercises given     I reviewed the patient's prescription history over the last 12 months in the multi-state controlled substances database(s) that includes [place], [place], [place], [place], [place], [place], [place], [place], [place], [place], [place], [place], [place], and [place].  Results were notable for no recent controlled substances. ____________________________________________  FINAL CLINICAL IMPRESSION(S) / ED DIAGNOSES  Final diagnoses:  Acute pain of both knees  Weakness of both lower extremities      Lorre Munroe, NP 04/01/20 2220    Emily Filbert, MD 04/01/20 [phone]

## 2020-05-01 ENCOUNTER — Emergency Department: Payer: Self-pay

## 2020-05-01 ENCOUNTER — Other Ambulatory Visit: Payer: Self-pay

## 2020-05-01 ENCOUNTER — Emergency Department
Admission: EM | Admit: 2020-05-01 | Discharge: 2020-05-01 | Disposition: A | Discharge status: Home / Self Care | Payer: Self-pay | Attending: Emergency Medicine | Admitting: Emergency Medicine

## 2020-05-01 DIAGNOSIS — Y939 Activity, unspecified: Secondary | ICD-10-CM | POA: Insufficient documentation

## 2020-05-01 DIAGNOSIS — Y999 Unspecified external cause status: Secondary | ICD-10-CM | POA: Insufficient documentation

## 2020-05-01 DIAGNOSIS — X509XXA Other and unspecified overexertion or strenuous movements or postures, initial encounter: Secondary | ICD-10-CM | POA: Insufficient documentation

## 2020-05-01 DIAGNOSIS — S93402A Sprain of unspecified ligament of left ankle, initial encounter: Secondary | ICD-10-CM | POA: Insufficient documentation

## 2020-05-01 DIAGNOSIS — Y929 Unspecified place or not applicable: Secondary | ICD-10-CM | POA: Insufficient documentation

## 2020-05-01 MED ORDER — IBUPROFEN 600 MG PO TABS: 600.0000 mg | ORAL_TABLET | Freq: Four times a day (QID) | ORAL | 0 refills | Status: DC | PRN

## 2020-05-01 NOTE — ED Triage Notes (Signed)
Pt was walking down stairs and rolled L ankle. Came to ER wrapped in coban. Ice pack applied in triage. A&O.

## 2020-05-01 NOTE — ED Notes (Signed)
Walker given by this Char[employer] fundraiser.  Pt demonstrated use.

## 2020-05-01 NOTE — ED Provider Notes (Signed)
[facility] Emergency Department Provider Note  ____________________________________________  Time seen: Approximately 7:45 PM  I have reviewed the triage vital signs and the nursing notes.   HISTORY  Chief Complaint Ankle Pain    HPI Deanna Arellano is a 49 y.o. female that presents to the emergency department for evaluation of left ankle pain after rolling her ankle tonight.  Patient was taking a step out the door when she rolled her ankle.  She denies any additional injuries.   History reviewed. No pertinent past medical history.  There are no problems to display for this patient.   Past Surgical History:  Procedure Laterality Date  . ABDOMINAL HYSTERECTOMY      Prior to Admission medications   Medication Sig Start Date End Date Taking? Authorizing Provider  doxycycline (VIBRAMYCIN) 100 MG capsule Take 1 capsule (100 mg total) by mouth 2 (two) times daily. 10/03/18   Renford Dills, NP  fluticasone (FLONASE) 50 MCG/ACT nasal spray Place 1 spray into both nostrils daily. 10/03/18   Renford Dills, NP  ibuprofen (ADVIL) 600 MG tablet Take 1 tablet (600 mg total) by mouth every 6 (six) hours as needed. 05/01/20   Enid Derry, PA-C  naproxen (NAPROSYN) 500 MG tablet Take 1 tablet (500 mg total) by mouth 2 (two) times daily with a meal. 04/01/20 04/01/21  Lorre Munroe, NP    Allergies Amoxicillin  Family History  Problem Relation Age of Onset  . Asthma Mother   . Hypertension Father     Social History Social History   Tobacco Use  . Smoking status: Never Smoker  . Smokeless tobacco: Never Used  Vaping Use  . Vaping Use: Never used  Substance Use Topics  . Alcohol use: Yes    Comment: occ  . Drug use: No     Review of Systems  Respiratory: No SOB. Gastrointestinal: No nausea, no vomiting.  Musculoskeletal: Positive for ankle pain. Skin: Negative for rash, abrasions, lacerations, ecchymosis. Neurological: Negative for numbness or  tingling   ____________________________________________   PHYSICAL EXAM:  VITAL SIGNS: ED Triage Vitals  Enc Vitals Group     BP 05/01/20 1833 132/84     Pulse Rate 05/01/20 1833 95     Resp 05/01/20 1833 16     Temp 05/01/20 1833 98.6 F (37 C)     Temp Source 05/01/20 1833 Oral     SpO2 05/01/20 1833 98 %     Weight 05/01/20 1831 230 lb (104.3 kg)     Height 05/01/20 1831 5\' 7"  (1.702 m)     Head Circumference --      Peak Flow --      Pain Score 05/01/20 1831 8     Pain Loc --      Pain Edu? --      Excl. in GC? --      Constitutional: Alert and oriented. Well appearing and in no acute distress. Eyes: Conjunctivae are normal. PERRL. EOMI. Head: Atraumatic. ENT:      Ears:      Nose: No congestion/rhinnorhea.      Mouth/Throat: Mucous membranes are moist.  Neck: No stridor. Cardiovascular: Normal rate, regular rhythm.  Good peripheral circulation.  Symmetric pedal pulses bilaterally. Respiratory: Normal respiratory effort without tachypnea or retractions. Lungs CTAB. Good air entry to the bases with no decreased or absent breath sounds. Musculoskeletal: Full range of motion to all extremities. No gross deformities appreciated. Swelling to left lateral malleolus.   Neurologic:  Normal  speech and language. No gross focal neurologic deficits are appreciated.  Skin:  Skin is warm, dry and intact. No rash noted. Psychiatric: Mood and affect are normal. Speech and behavior are normal. Patient exhibits appropriate insight and judgement.   ____________________________________________   LABS (all labs ordered are listed, but only abnormal results are displayed)  Labs Reviewed - No data to display ____________________________________________  EKG   ____________________________________________  RADIOLOGY Lexine Baton, personally viewed and evaluated these images (plain radiographs) as part of my medical decision making, as well as reviewing the written report by  the radiologist.  DG Ankle Complete Left  Result Date: 05/01/2020 CLINICAL DATA:  Rolled ankle EXAM: LEFT ANKLE COMPLETE - 3+ VIEW COMPARISON:  None. FINDINGS: There is no evidence of fracture, dislocation, or joint effusion. There is no evidence of arthropathy or other focal bone abnormality. Soft tissue edema about the ankle. IMPRESSION: No fracture or dislocation of the left ankle. Joint spaces are preserved. Soft tissue edema about the ankle. Electronically Signed   By: Lauralyn Primes M.D.   On: 05/01/2020 19:15    ____________________________________________    PROCEDURES  Procedure(s) performed:    Procedures    Medications - No data to display   ____________________________________________   INITIAL IMPRESSION / ASSESSMENT AND PLAN / ED COURSE  Pertinent labs & imaging results that were available during my care of the patient were reviewed by me and considered in my medical decision making (see chart for details).  Review of the [place] CSRS was performed in accordance of the [facility] prior to dispensing any controlled drugs.     Patient's diagnosis is consistent with ankle sprain.  Ankle x-ray negative for acute bony abnormality.  Walking boot was placed.  Crutches were given.  Patient is to follow up with orthopedics as directed. Patient is given ED precautions to return to the ED for any worsening or new symptoms.  Deanna Arellano was evaluated in Emergency Department on 05/01/2020 for the symptoms described in the history of present illness. She was evaluated in the context of the global COVID-19 pandemic, which necessitated consideration that the patient might be at risk for infection with the SARS-CoV-2 virus that causes COVID-19. Institutional protocols and algorithms that pertain to the evaluation of patients at risk for COVID-19 are in a state of rapid change based on information released by regulatory bodies including the CDC and federal and state organizations. These policies  and algorithms were followed during the patient's care in the ED.   ____________________________________________  FINAL CLINICAL IMPRESSION(S) / ED DIAGNOSES  Final diagnoses:  Sprain of left ankle, unspecified ligament, initial encounter      NEW MEDICATIONS STARTED DURING THIS VISIT:  ED Discharge Orders         Ordered    ibuprofen (ADVIL) 600 MG tablet  Every 6 hours PRN     Discontinue  Reprint     05/01/20 2024              This chart was dictated using voice recognition software/Dragon. Despite best efforts to proofread, errors can occur which can change the meaning. Any change was purely unintentional.    Enid Derry, PA-C 05/01/20 2310    Arnaldo Natal, MD 05/01/20 [phone]

## 2020-05-01 NOTE — ED Notes (Signed)
Cam boot applied by [employer].

## 2020-09-22 ENCOUNTER — Emergency Department
Admission: EM | Admit: 2020-09-22 | Discharge: 2020-09-22 | Disposition: A | Discharge status: Home / Self Care | Payer: Medicaid Other | Attending: Emergency Medicine | Admitting: Emergency Medicine

## 2020-09-22 ENCOUNTER — Other Ambulatory Visit: Payer: Self-pay

## 2020-09-22 ENCOUNTER — Encounter: Payer: Self-pay | Admitting: Emergency Medicine

## 2020-09-22 DIAGNOSIS — R109 Unspecified abdominal pain: Secondary | ICD-10-CM | POA: Insufficient documentation

## 2020-09-22 DIAGNOSIS — R112 Nausea with vomiting, unspecified: Secondary | ICD-10-CM | POA: Insufficient documentation

## 2020-09-22 LAB — URINALYSIS, COMPLETE (UACMP) WITH MICROSCOPIC
Bacteria, UA: NONE SEEN
Bilirubin Urine: NEGATIVE
Glucose, UA: NEGATIVE mg/dL
Hgb urine dipstick: NEGATIVE
Ketones, ur: NEGATIVE mg/dL
Leukocytes,Ua: NEGATIVE
Nitrite: NEGATIVE
Protein, ur: NEGATIVE mg/dL
Specific Gravity, Urine: 1.017 (ref 1.005–1.030)
pH: 5 (ref 5.0–8.0)

## 2020-09-22 LAB — CBC
HCT: 38.9 % (ref 36.0–46.0)
Hemoglobin: 12.2 g/dL (ref 12.0–15.0)
MCH: 25.1 pg — ABNORMAL LOW (ref 26.0–34.0)
MCHC: 31.4 g/dL (ref 30.0–36.0)
MCV: 80 fL (ref 80.0–100.0)
Platelets: 283 K/uL (ref 150–400)
RBC: 4.86 MIL/uL (ref 3.87–5.11)
RDW: 15.9 % — ABNORMAL HIGH (ref 11.5–15.5)
WBC: 8.1 K/uL (ref 4.0–10.5)
nRBC: 0 % (ref 0.0–0.2)

## 2020-09-22 LAB — BASIC METABOLIC PANEL WITH GFR
Anion gap: 9 (ref 5–15)
BUN: 14 mg/dL (ref 6–20)
CO2: 25 mmol/L (ref 22–32)
Calcium: 9 mg/dL (ref 8.9–10.3)
Chloride: 105 mmol/L (ref 98–111)
Creatinine, Ser: 0.6 mg/dL (ref 0.44–1.00)
GFR, Estimated: >60 mL/min
Glucose, Bld: 101 mg/dL — ABNORMAL HIGH (ref 70–99)
Potassium: 4.1 mmol/L (ref 3.5–5.1)
Sodium: 139 mmol/L (ref 135–145)

## 2020-09-22 MED ORDER — ONDANSETRON 4 MG PO TBDP: 4.0000 mg | ORAL_TABLET | Freq: Three times a day (TID) | ORAL | 0 refills | Status: DC | PRN

## 2020-09-22 NOTE — ED Provider Notes (Signed)
[facility] Emergency Department Provider Note   ____________________________________________    I have reviewed the triage vital signs and the nursing notes.   HISTORY  Chief Complaint Flank Pain, Emesis, and Headache     HPI TORREY Arellano is a 49 y.o. female who presents with complaints of right-sided flank pain and an episode of nausea and vomiting, now greatly improved.  Patient reports this happened earlier this morning at work.  She reports a history of urinary tract infection, denies dysuria.  No fevers or chills reported.  She reports sharp right-sided flank pain which has resolved.  Did not take anything for this.  No sick contacts reported  History reviewed. No pertinent past medical history.  There are no problems to display for this patient.   Past Surgical History:  Procedure Laterality Date  . ABDOMINAL HYSTERECTOMY      Prior to Admission medications   Medication Sig Start Date End Date Taking? Authorizing Provider  ondansetron (ZOFRAN ODT) 4 MG disintegrating tablet Take 1 tablet (4 mg total) by mouth every 8 (eight) hours as needed. 09/22/20  Yes Jene Every, MD  doxycycline (VIBRAMYCIN) 100 MG capsule Take 1 capsule (100 mg total) by mouth 2 (two) times daily. 10/03/18   Renford Dills, NP  fluticasone (FLONASE) 50 MCG/ACT nasal spray Place 1 spray into both nostrils daily. 10/03/18   Renford Dills, NP  ibuprofen (ADVIL) 600 MG tablet Take 1 tablet (600 mg total) by mouth every 6 (six) hours as needed. 05/01/20   Enid Derry, PA-C  naproxen (NAPROSYN) 500 MG tablet Take 1 tablet (500 mg total) by mouth 2 (two) times daily with a meal. 04/01/20 04/01/21  Lorre Munroe, NP     Allergies Amoxicillin  Family History  Problem Relation Age of Onset  . Asthma Mother   . Hypertension Father     Social History Social History   Tobacco Use  . Smoking status: Never Smoker  . Smokeless tobacco: Never Used  Vaping Use  .  Vaping Use: Never used  Substance Use Topics  . Alcohol use: Yes    Comment: occ  . Drug use: No    Review of Systems  Constitutional: No fever/chills Eyes: No visual changes.  ENT: No sore throat. Cardiovascular: Denies chest pain. Respiratory: Denies shortness of breath. Gastrointestinal: As above Genitourinary: As above Musculoskeletal: Negative for back pain. Skin: Negative for rash. Neurological: Negative for headaches or weakness   ____________________________________________   PHYSICAL EXAM:  VITAL SIGNS: ED Triage Vitals  Enc Vitals Group     BP 09/22/20 1201 (!) 152/88     Pulse Rate 09/22/20 1201 (!) 59     Resp 09/22/20 1201 16     Temp 09/22/20 1201 98.2 F (36.8 C)     Temp Source 09/22/20 1201 Oral     SpO2 09/22/20 1201 100 %     Weight 09/22/20 1202 95.3 kg (210 lb)     Height 09/22/20 1202 1.702 m (5\' 7" )     Head Circumference --      Peak Flow --      Pain Score 09/22/20 1202 4     Pain Loc --      Pain Edu? --      Excl. in GC? --     Constitutional: Alert and oriented.  . Nose: No congestion/rhinnorhea. Mouth/Throat: Mucous membranes are moist.    Cardiovascular: Normal rate, regular rhythm. Grossly normal heart sounds.  Good peripheral circulation. Respiratory:  Normal respiratory effort.  No retractions. Lungs CTAB. Gastrointestinal: Soft and nontender. No distention.  No CVA tenderness.  Musculoskeletal: No lower extremity tenderness nor edema.  Warm and well perfused Neurologic:  Normal speech and language. No gross focal neurologic deficits are appreciated.  Skin:  Skin is warm, dry and intact. No rash noted. Psychiatric: Mood and affect are normal. Speech and behavior are normal.  ____________________________________________   LABS (all labs ordered are listed, but only abnormal results are displayed)  Labs Reviewed  URINALYSIS, COMPLETE (UACMP) WITH MICROSCOPIC - Abnormal; Notable for the following components:      Result  Value   Color, Urine YELLOW (*)    APPearance HAZY (*)    All other components within normal limits  BASIC METABOLIC PANEL - Abnormal; Notable for the following components:   Glucose, Bld 101 (*)    All other components within normal limits  CBC - Abnormal; Notable for the following components:   MCH 25.1 (*)    RDW 15.9 (*)    All other components within normal limits   ____________________________________________  EKG   ____________________________________________  RADIOLOGY   ____________________________________________   PROCEDURES  Procedure(s) performed: No  Procedures   Critical Care performed: No ____________________________________________   INITIAL IMPRESSION / ASSESSMENT AND PLAN / ED COURSE  Pertinent labs & imaging results that were available during my care of the patient were reviewed by me and considered in my medical decision making (see chart for details).  Patient presents with flank pain as detailed above, however symptoms have resolved.  Differential includes ureterolithiasis, urinary tract infection, musculoskeletal pain.  Urinalysis is quite reassuring, no hematuria to suggest ureterolithiasis, no evidence of infection  CBC and BMP are normal as well.  Appropriate for discharge at this time outpatient follow-up, return precautions discussed    ____________________________________________   FINAL CLINICAL IMPRESSION(S) / ED DIAGNOSES  Final diagnoses:  Right flank pain        Note:  This document was prepared using Dragon voice recognition software and may include unintentional dictation errors.   Jene Every, MD 09/22/20 1455

## 2020-09-22 NOTE — ED Notes (Signed)
Pt reports she had pain to lower abdomen from umbilicus reaching around to the right towards the right flank; denies pain with urination. Emesis episode x1, no diarrhea

## 2020-09-22 NOTE — ED Notes (Signed)
Pt left prior to receiving discharge instructions and paperwork. 

## 2020-09-22 NOTE — ED Triage Notes (Signed)
Pt comes into the ED via POv c/o right flank pain, headache, and emesis.  Pt denies any h/o kidney stones but states she has a significant h/o UTI's.  Pt states she had difficulty urinating as well.  Pt had last BM was today.  Denies any CP, dizziness, or SHOB.  Pt in NAD at this time with even and unlabored respirations and is ambulatory to triage.

## 2021-01-13 ENCOUNTER — Other Ambulatory Visit: Payer: Self-pay

## 2021-01-13 ENCOUNTER — Emergency Department
Admission: EM | Admit: 2021-01-13 | Discharge: 2021-01-14 | Disposition: A | Discharge status: Home / Self Care | Payer: Medicaid Other | Attending: Emergency Medicine | Admitting: Emergency Medicine

## 2021-01-13 ENCOUNTER — Encounter: Payer: Self-pay | Admitting: Emergency Medicine

## 2021-01-13 DIAGNOSIS — J329 Chronic sinusitis, unspecified: Secondary | ICD-10-CM

## 2021-01-13 DIAGNOSIS — J069 Acute upper respiratory infection, unspecified: Secondary | ICD-10-CM | POA: Insufficient documentation

## 2021-01-13 DIAGNOSIS — J019 Acute sinusitis, unspecified: Secondary | ICD-10-CM | POA: Insufficient documentation

## 2021-01-13 MED ORDER — HYDROCOD POLST-CPM POLST ER 10-8 MG/5ML PO SUER
5.0000 mL | Freq: Once | ORAL | Status: AC
  Administered 2021-01-14: 5 mL via ORAL
  Filled 2021-01-13: qty 5

## 2021-01-13 NOTE — ED Triage Notes (Signed)
Pt in via POV, states, "I think I have a sinus infection, I normally get one once a year."  Reports thick green nasal drainage and cough x approximately one week.  Ambulatory to triage, NAD noted at this time.

## 2021-01-14 MED ORDER — DOXYCYCLINE HYCLATE 100 MG PO CAPS: 100.0000 mg | ORAL_CAPSULE | Freq: Two times a day (BID) | ORAL | 0 refills | Status: DC

## 2021-01-14 MED ORDER — HYDROCOD POLST-CPM POLST ER 10-8 MG/5ML PO SUER: 5.0000 mL | Freq: Two times a day (BID) | ORAL | 0 refills | Status: DC | PRN

## 2021-01-14 MED ORDER — DOXYCYCLINE HYCLATE 100 MG PO CAPS: 100.0000 mg | ORAL_CAPSULE | Freq: Two times a day (BID) | ORAL | 0 refills | Status: AC

## 2021-01-14 NOTE — ED Provider Notes (Signed)
[facility] Emergency Department Provider Note  ____________________________________________   Event Date/Time   First MD Initiated Contact with Patient 01/13/21 2327     (approximate)  I have reviewed the triage vital signs and the nursing notes.   HISTORY  Chief Complaint Nasal Congestion    HPI Deanna Arellano is a 50 y.o. female with no contributory medical issues who presents for evaluation of nasal congestion, sinus pressure, and cough.  Symptoms been going on about 2 weeks.  She said about once a year she gets a "sinus infection" for which she takes antibiotics and she usually gets better.  She has been trying to take over-the-counter medication such as Mucinex and Benadryl but it does not seem to be helping.  She is not having any trouble breathing but her cough is worse at night and sometimes wakes her up.  She denies fever, chills, chest pain, nausea, vomiting, abdominal pain, and dysuria.         History reviewed. No pertinent past medical history.  There are no problems to display for this patient.   Past Surgical History:  Procedure Laterality Date  . ABDOMINAL HYSTERECTOMY      Prior to Admission medications   Medication Sig Start Date End Date Taking? Authorizing Provider  chlorpheniramine-HYDROcodone (TUSSIONEX PENNKINETIC ER) 10-8 MG/5ML SUER Take 5 mLs by mouth every 12 (twelve) hours as needed for cough. 01/14/21  Yes Loleta Rose, MD  doxycycline (VIBRAMYCIN) 100 MG capsule Take 1 capsule (100 mg total) by mouth 2 (two) times daily for 10 days. 01/14/21 01/24/21 Yes Loleta Rose, MD  fluticasone (FLONASE) 50 MCG/ACT nasal spray Place 1 spray into both nostrils daily. 10/03/18   Renford Dills, NP  ibuprofen (ADVIL) 600 MG tablet Take 1 tablet (600 mg total) by mouth every 6 (six) hours as needed. 05/01/20   Enid Derry, PA-C  naproxen (NAPROSYN) 500 MG tablet Take 1 tablet (500 mg total) by mouth 2 (two) times daily with a meal.  04/01/20 04/01/21  Lorre Munroe, NP  ondansetron (ZOFRAN ODT) 4 MG disintegrating tablet Take 1 tablet (4 mg total) by mouth every 8 (eight) hours as needed. 09/22/20   Jene Every, MD    Allergies Amoxicillin  Family History  Problem Relation Age of Onset  . Asthma Mother   . Hypertension Father     Social History Social History   Tobacco Use  . Smoking status: Never Smoker  . Smokeless tobacco: Never Used  Vaping Use  . Vaping Use: Never used  Substance Use Topics  . Alcohol use: Yes  . Drug use: No    Review of Systems Constitutional: No fever/chills Eyes: No visual changes. ENT: Nasal congestion and runny nose and sinus pressure. Cardiovascular: Denies chest pain. Respiratory: No shortness of breath but with frequent cough productive of thick mucus. Gastrointestinal: No abdominal pain.  No nausea, no vomiting.  No diarrhea.  No constipation. Genitourinary: Negative for dysuria. Musculoskeletal: Negative for neck pain.  Negative for back pain. Integumentary: Negative for rash. Neurological: Negative for headaches, focal weakness or numbness.   ____________________________________________   PHYSICAL EXAM:  VITAL SIGNS: ED Triage Vitals  Enc Vitals Group     BP 01/13/21 2114 (!) 161/90     Pulse Rate 01/13/21 2114 97     Resp 01/13/21 2114 17     Temp 01/13/21 2114 98.2 F (36.8 C)     Temp Source 01/13/21 2114 Oral     SpO2 01/13/21 2114 99 %  Weight 01/13/21 2115 106.6 kg (235 lb)     Height 01/13/21 2115 1.727 m (5\' 8" )     Head Circumference --      Peak Flow --      Pain Score 01/13/21 2115 0     Pain Loc --      Pain Edu? --      Excl. in GC? --     Constitutional: Alert and oriented.  Eyes: Conjunctivae are normal.  Head: Atraumatic. Nose: Mild congestion/rhinnorhea.  Some bilateral maxillary sinus tenderness to palpation Mouth/Throat: Patient is wearing a mask. Neck: No stridor.  No meningeal signs.   Cardiovascular: Normal rate,  regular rhythm. Good peripheral circulation. Respiratory: Normal respiratory effort.  No coarse breath sounds, no wheezing, occasional cough. Gastrointestinal: Soft and nontender. No distention.  Musculoskeletal: No lower extremity tenderness nor edema. No gross deformities of extremities. Neurologic:  Normal speech and language. No gross focal neurologic deficits are appreciated.  Skin:  Skin is warm, dry and intact. Psychiatric: Mood and affect are normal. Speech and behavior are normal.  ____________________________________________   LABS (all labs ordered are listed, but only abnormal results are displayed)  Labs Reviewed - No data to display  ____________________________________________   INITIAL IMPRESSION / MDM / ASSESSMENT AND PLAN / ED COURSE  As part of my medical decision making, I reviewed the following data within the electronic MEDICAL RECORD NUMBER Nursing notes reviewed and incorporated, Old chart reviewed, Notes from prior ED visits and [place] Controlled Substance Database   Differential diagnosis includes, but is not limited to, viral infection, bacterial sinusitis, COVID-19.  Patient is well-appearing and in no distress.  Vital signs are stable other than some hypertension.  She gets the symptoms roughly annually and tends to get better with antibiotics.  Given 2 weeks of symptoms it is less likely to be an acute COVID infection and she is otherwise well.  She is not having any breathing difficulties and has clear lungs upon auscultation and has an SPO2 of 100%.  No indication for chest x-ray.  I talked with her about conservative management versus antibiotics and she would prefer to try a course of antibiotics.  I think it is reasonable to try a 10-day course of doxycycline.  Also giving her an antitussive prescription as well.  I recommended establishing a PCP as an outpatient.  She understands and agrees with the plan and I gave my usual and customary return precautions.            ____________________________________________  FINAL CLINICAL IMPRESSION(S) / ED DIAGNOSES  Final diagnoses:  Viral URI with cough  Sinusitis, unspecified chronicity, unspecified location     MEDICATIONS GIVEN DURING THIS VISIT:  Medications  chlorpheniramine-HYDROcodone (TUSSIONEX) 10-8 MG/5ML suspension 5 mL (5 mLs Oral Given 01/14/21 0004)     ED Discharge Orders         Ordered    chlorpheniramine-HYDROcodone (TUSSIONEX PENNKINETIC ER) 10-8 MG/5ML SUER  Every 12 hours PRN        01/14/21 0142    doxycycline (VIBRAMYCIN) 100 MG capsule  2 times daily        01/14/21 0142          *Please note:  ALEXANDERIA GORBY was evaluated in Emergency Department on 01/14/2021 for the symptoms described in the history of present illness. She was evaluated in the context of the global COVID-19 pandemic, which necessitated consideration that the patient might be at risk for infection with the SARS-CoV-2 virus that causes  COVID-19. Institutional protocols and algorithms that pertain to the evaluation of patients at risk for COVID-19 are in a state of rapid change based on information released by regulatory bodies including the CDC and federal and state organizations. These policies and algorithms were followed during the patient's care in the ED.  Some ED evaluations and interventions may be delayed as a result of limited staffing during and after the pandemic.*  Note:  This document was prepared using Dragon voice recognition software and may include unintentional dictation errors.   Loleta Rose, MD 01/14/21 [phone]

## 2021-03-21 ENCOUNTER — Emergency Department
Admission: EM | Admit: 2021-03-21 | Discharge: 2021-03-21 | Disposition: A | Discharge status: Home / Self Care | Payer: Medicaid Other | Attending: Emergency Medicine | Admitting: Emergency Medicine

## 2021-03-21 ENCOUNTER — Emergency Department: Payer: Medicaid Other

## 2021-03-21 ENCOUNTER — Other Ambulatory Visit: Payer: Self-pay

## 2021-03-21 DIAGNOSIS — S8991XA Unspecified injury of right lower leg, initial encounter: Secondary | ICD-10-CM | POA: Insufficient documentation

## 2021-03-21 DIAGNOSIS — Y9301 Activity, walking, marching and hiking: Secondary | ICD-10-CM | POA: Insufficient documentation

## 2021-03-21 DIAGNOSIS — X501XXA Overexertion from prolonged static or awkward postures, initial encounter: Secondary | ICD-10-CM | POA: Insufficient documentation

## 2021-03-21 MED ORDER — OXYCODONE HCL 5 MG PO TABS: 5.0000 mg | ORAL_TABLET | Freq: Four times a day (QID) | ORAL | 0 refills | Status: AC | PRN

## 2021-03-21 MED ORDER — ACETAMINOPHEN 500 MG PO TABS
1000.0000 mg | ORAL_TABLET | Freq: Once | ORAL | Status: AC
  Administered 2021-03-21: 13:00:00 1000 mg via ORAL
  Filled 2021-03-21: qty 2

## 2021-03-21 MED ORDER — KETOROLAC TROMETHAMINE 30 MG/ML IJ SOLN
30.0000 mg | Freq: Once | INTRAMUSCULAR | Status: AC
  Administered 2021-03-21: 13:00:00 30 mg via INTRAMUSCULAR
  Filled 2021-03-21: qty 1

## 2021-03-21 NOTE — ED Triage Notes (Signed)
Pt states she twisted her right knee yesterday and is having pain with swelling

## 2021-03-21 NOTE — ED Notes (Signed)
See triage note  Presents with right knee since last pm  States she twisted her yesterday  Denies any fall  But unable to bear wt  Pain is mainly posterior

## 2021-03-21 NOTE — ED Provider Notes (Signed)
[facility] Emergency Department Provider Note  ____________________________________________   Event Date/Time   First MD Initiated Contact with Patient 03/21/21 1207     (approximate)  I have reviewed the triage vital signs and the nursing notes.   HISTORY  Chief Complaint Knee Injury    HPI Deanna Arellano is a 50 y.o. female with prior abdominal hysterectomy who comes in with right knee pain.  Patient reports that she was walking when she was coming down off the steps and twisted her knee.  She has developed severe pain, constant, worse with movement, better at rest.  She denies falling to the ground, hitting the knee.       History reviewed. No pertinent past medical history.  There are no problems to display for this patient.   Past Surgical History:  Procedure Laterality Date   ABDOMINAL HYSTERECTOMY      Prior to Admission medications   Medication Sig Start Date End Date Taking? Authorizing Provider  fluticasone (FLONASE) 50 MCG/ACT nasal spray Place 1 spray into both nostrils daily. 10/03/18   Renford Dills, NP    Allergies Amoxicillin  Family History  Problem Relation Age of Onset   Asthma Mother    Hypertension Father     Social History Social History   Tobacco Use   Smoking status: Never   Smokeless tobacco: Never  Vaping Use   Vaping Use: Never used  Substance Use Topics   Alcohol use: Yes   Drug use: No      Review of Systems Constitutional: No fever/chills Eyes: No visual changes. ENT: No sore throat. Cardiovascular: Denies chest pain. Respiratory: Denies shortness of breath. Gastrointestinal: No abdominal pain.  No nausea, no vomiting.  No diarrhea.  No constipation. Genitourinary: Negative for dysuria. Musculoskeletal: Negative for back pain.  Knee pain Skin: Negative for rash. Neurological: Negative for headaches, focal weakness or numbness. All other ROS  negative ____________________________________________   PHYSICAL EXAM:  VITAL SIGNS: ED Triage Vitals  Enc Vitals Group     BP 03/21/21 1146 138/66     Pulse Rate 03/21/21 1146 75     Resp 03/21/21 1146 17     Temp 03/21/21 1148 98.1 F (36.7 C)     Temp Source 03/21/21 1146 Oral     SpO2 03/21/21 1146 98 %     Weight 03/21/21 1147 230 lb (104.3 kg)     Height 03/21/21 1147 5\' 8"  (1.727 m)     Head Circumference --      Peak Flow --      Pain Score 03/21/21 1147 5     Pain Loc --      Pain Edu? --      Excl. in GC? --     Constitutional: Alert and oriented. Well appearing and in no acute distress. Eyes: Conjunctivae are normal. EOMI. Head: Atraumatic. Nose: No congestion/rhinnorhea. Mouth/Throat: Mucous membranes are moist.   Neck: No stridor. Trachea Midline. FROM Cardiovascular: Normal rate, regular rhythm. Grossly normal heart sounds.  Good peripheral circulation. Respiratory: Normal respiratory effort.  No retractions. Lungs CTAB. Gastrointestinal: Soft and nontender. No distention. No abdominal bruits.  Musculoskeletal: No lower extremity tenderness nor edema.  No joint effusions.  2+ distal pulse some pain on the posterior aspect of the knee.  Limited range of motion of the knee secondary to pain.  Little bit of swelling noted.  No warmth or redness.  No calf tenderness, able to plantarflex and dorsiflex. Neurologic:  Normal speech  and language. No gross focal neurologic deficits are appreciated.  Skin:  Skin is warm, dry and intact. No rash noted. Psychiatric: Mood and affect are normal. Speech and behavior are normal. GU: Deferred   ____________________________________________    RADIOLOGY  Official radiology report(s): DG Knee Complete 4 Views Right  Result Date: 03/21/2021 CLINICAL DATA:  Pain EXAM: RIGHT KNEE - COMPLETE 4+ VIEW COMPARISON:  April 01, 2020 FINDINGS: Frontal, lateral, and bilateral oblique views were obtained. No evident fracture or  dislocation. There is a small joint effusion. There is moderate narrowing of the patellofemoral joint. Other joint spaces appear unremarkable. There is spurring in all compartments. 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 Small joint effusion. No fracture or dislocation. Electronically Signed   By: Bretta Bang III M.D.   On: 03/21/2021 12:56    ____________________________________________   PROCEDURES  Procedure(s) performed (including Critical Care):  Procedures   ____________________________________________   INITIAL IMPRESSION / ASSESSMENT AND PLAN / ED COURSE  Deanna Arellano was evaluated in Emergency Department on 03/21/2021 for the symptoms described in the history of present illness. She was evaluated in the context of the global COVID-19 pandemic, which necessitated consideration that the patient might be at risk for infection with the SARS-CoV-2 virus that causes COVID-19. Institutional protocols and algorithms that pertain to the evaluation of patients at risk for COVID-19 are in a state of rapid change based on information released by regulatory bodies including the CDC and federal and state organizations. These policies and algorithms were followed during the patient's care in the ED.    Patient comes in with twisting of her right knee with some swelling noted to the leg.  Will get x-ray to evaluate for any fractures but suspect this is most likely ligament or meniscus injury.  Good distal pulse.  Unlikely DVT.  X-ray is reassuring does show signs of some osteoarthritis changes.  Recommended crutches but patient states that she has a hard time using crutches so she will use a walker.  We wrapped the knee.  Recommended Tylenol and ibuprofen for pain and given a few oxycodone for breakthrough pain to use at nighttime.  Understands not to drive or work while on these.  Patient will need follow-up with orthopedics for further  work-up  I discussed the provisional nature of ED diagnosis, the treatment so far, the ongoing plan of care, follow up appointments and return precautions with the patient and any family or support people present. They expressed understanding and agreed with the plan, discharged home.          ____________________________________________   FINAL CLINICAL IMPRESSION(S) / ED DIAGNOSES   Final diagnoses:  Injury of right knee, initial encounter      MEDICATIONS GIVEN DURING THIS VISIT:  Medications  acetaminophen (TYLENOL) tablet 1,000 mg (1,000 mg Oral Given 03/21/21 1236)  ketorolac (TORADOL) 30 MG/ML injection 30 mg (30 mg Intramuscular Given 03/21/21 1236)     ED Discharge Orders          Ordered    oxyCODONE (ROXICODONE) 5 MG immediate release tablet  Every 6 hours PRN        03/21/21 1351             Note:  This document was prepared using Dragon voice recognition software and may include unintentional dictation errors.    Concha Se, MD 03/21/21 1351

## 2021-03-21 NOTE — Discharge Instructions (Addendum)
You can take Tylenol 1 g every 8 hours and ibuprofen 600 every 6 hours with food to help with pain.  I given you a few oxycodone to use for breakthrough pain.  Try to use only at nighttime.  Do not drive or work while on these.  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 Small joint effusion. No fracture or dislocation.  Take oxycodone as prescribed. Do not drink alcohol, drive or participate in any other potentially dangerous activities while taking this medication as it may make you sleepy. Do not take this medication with any other sedating medications, either prescription or over-the-counter. If you were prescribed Percocet or Vicodin, do not take these with acetaminophen (Tylenol) as it is already contained within these medications.  This medication is an opiate (or narcotic) pain medication and can be habit forming. Use it as little as possible to achieve adequate pain control. Do not use or use it with extreme caution if you have a history of opiate abuse or dependence. If you are on a pain contract with your primary care doctor or a pain specialist, be sure to let them know you were prescribed this medication today from the Emergency Department. This medication is intended for your use only - do not give any to anyone else and keep it in a secure place where nobody else, especially children, have access to it.

## 2021-09-07 ENCOUNTER — Emergency Department
Admission: EM | Admit: 2021-09-07 | Discharge: 2021-09-07 | Disposition: A | Discharge status: Home / Self Care | Payer: Medicaid Other | Attending: Emergency Medicine | Admitting: Emergency Medicine

## 2021-09-07 ENCOUNTER — Encounter: Payer: Self-pay | Admitting: Emergency Medicine

## 2021-09-07 ENCOUNTER — Other Ambulatory Visit: Payer: Self-pay

## 2021-09-07 DIAGNOSIS — Z20822 Contact with and (suspected) exposure to covid-19: Secondary | ICD-10-CM | POA: Insufficient documentation

## 2021-09-07 DIAGNOSIS — J069 Acute upper respiratory infection, unspecified: Secondary | ICD-10-CM | POA: Insufficient documentation

## 2021-09-07 LAB — RESP PANEL BY RT-PCR (FLU A&B, COVID) ARPGX2
Influenza A by PCR: NEGATIVE
Influenza B by PCR: NEGATIVE
SARS Coronavirus 2 by RT PCR: NEGATIVE

## 2021-09-07 NOTE — ED Triage Notes (Signed)
Body aches, chills , sinus congestion since Saturday.

## 2021-09-07 NOTE — ED Provider Notes (Signed)
[facility] Emergency Department Provider Note   ____________________________________________    I have reviewed the triage vital signs and the nursing notes.   HISTORY  Chief Complaint URI     HPI Deanna Arellano is a 50 y.o. female who presents with complaints of mild sore throat, congestion, cough.  Some body aches, fatigue x4 days.  Reports sore throat has improved significantly.  Denies shortness of breath.  Has had original COVID vaccines, no flu vaccine  History reviewed. No pertinent past medical history.  There are no problems to display for this patient.   Past Surgical History:  Procedure Laterality Date   ABDOMINAL HYSTERECTOMY      Prior to Admission medications   Medication Sig Start Date End Date Taking? Authorizing Provider  fluticasone (FLONASE) 50 MCG/ACT nasal spray Place 1 spray into both nostrils daily. 10/03/18   Renford Dills, NP     Allergies Amoxicillin  Family History  Problem Relation Age of Onset   Asthma Mother    Hypertension Father     Social History Social History   Tobacco Use   Smoking status: Never   Smokeless tobacco: Never  Vaping Use   Vaping Use: Never used  Substance Use Topics   Alcohol use: Yes   Drug use: No    Review of Systems  Constitutional: Chills  ENT: Sore throat, congestion as above   Gastrointestinal: No abdominal pain.  No nausea, no vomiting.   Genitourinary: Negative for dysuria. Musculoskeletal: Myalgias Skin: Negative for rash. Neurological: Negative for headaches     ____________________________________________   PHYSICAL EXAM:  VITAL SIGNS: ED Triage Vitals  Enc Vitals Group     BP 09/07/21 1042 132/88     Pulse Rate 09/07/21 1042 90     Resp 09/07/21 1042 18     Temp 09/07/21 1042 98.2 F (36.8 C)     Temp Source 09/07/21 1042 Oral     SpO2 09/07/21 1042 95 %     Weight 09/07/21 1037 104.3 kg (229 lb 15 oz)     Height 09/07/21 1037 1.727 m (5'  8")     Head Circumference --      Peak Flow --      Pain Score 09/07/21 1037 0     Pain Loc --      Pain Edu? --      Excl. in GC? --      Constitutional: Alert and oriented. No acute distress. Pleasant and interactive Eyes: Conjunctivae are normal.  Head: Atraumatic. Nose: No congestion/rhinnorhea. Mouth/Throat: Mucous membranes are moist.  Pharynx exam normal Cardiovascular: Normal rate, regular rhythm.  Respiratory: Normal respiratory effort.  No retractions.  Clear auscultation bilaterally Genitourinary: deferred Musculoskeletal: No lower extremity tenderness nor edema.   Neurologic:  Normal speech and language. No gross focal neurologic deficits are appreciated.   Skin:  Skin is warm, dry and intact. No rash noted.   ____________________________________________   LABS (all labs ordered are listed, but only abnormal results are displayed)  Labs Reviewed  RESP PANEL BY RT-PCR (FLU A&B, COVID) [id number]   ____________________________________________  EKG   ____________________________________________  RADIOLOGY   ____________________________________________   PROCEDURES  Procedure(s) performed: No  Procedures   Critical Care performed: No ____________________________________________   INITIAL IMPRESSION / ASSESSMENT AND PLAN / ED COURSE  Pertinent labs & imaging results that were available during my care of the patient were reviewed by me and considered in my medical decision making (see chart  for details).   Patient well-appearing and in no acute distress, HPI and exam consistent with viral illness.  Influenza and COVID swab negative, recommend continued supportive care, outpatient follow-up with PCP if no improvement, return precautions discussed.   ____________________________________________   FINAL CLINICAL IMPRESSION(S) / ED DIAGNOSES  Final diagnoses:  Viral URI with cough      NEW MEDICATIONS STARTED DURING THIS VISIT:  New  Prescriptions   No medications on file     Note:  This document was prepared using Dragon voice recognition software and may include unintentional dictation errors.    Jene Every, MD 09/07/21 [phone]

## 2021-09-07 NOTE — ED Notes (Signed)
This RN to bedside for assessment. Pt. Sitting in chair, speaking full sentences and conversing easily, NAD. No SOB observed.

## 2021-09-07 NOTE — ED Notes (Signed)
ED Provider at bedside. 

## 2021-11-10 ENCOUNTER — Emergency Department (HOSPITAL_COMMUNITY)
Admission: EM | Admit: 2021-11-10 | Discharge: 2021-11-10 | Discharge status: Against Medical Advice | Payer: Medicaid Other

## 2021-11-10 NOTE — ED Notes (Signed)
PT decided to leave AMA °

## 2022-08-03 ENCOUNTER — Emergency Department
Admission: EM | Admit: 2022-08-03 | Discharge: 2022-08-03 | Discharge status: Against Medical Advice | Payer: Medicaid Other | Attending: Emergency Medicine | Admitting: Emergency Medicine

## 2022-08-03 ENCOUNTER — Other Ambulatory Visit: Payer: Self-pay

## 2022-08-03 DIAGNOSIS — Z5321 Procedure and treatment not carried out due to patient leaving prior to being seen by health care provider: Secondary | ICD-10-CM | POA: Insufficient documentation

## 2022-08-03 DIAGNOSIS — R200 Anesthesia of skin: Secondary | ICD-10-CM | POA: Insufficient documentation

## 2022-08-03 DIAGNOSIS — R519 Headache, unspecified: Secondary | ICD-10-CM | POA: Insufficient documentation

## 2022-08-03 LAB — BASIC METABOLIC PANEL
Anion gap: 6 (ref 5–15)
BUN: 10 mg/dL (ref 6–20)
CO2: 23 mmol/L (ref 22–32)
Calcium: 8.9 mg/dL (ref 8.9–10.3)
Chloride: 110 mmol/L (ref 98–111)
Creatinine, Ser: 0.65 mg/dL (ref 0.44–1.00)
GFR, Estimated: >60 mL/min (ref >60)
Glucose, Bld: 134 mg/dL — ABNORMAL HIGH (ref 70–99)
Potassium: 3.9 mmol/L (ref 3.5–5.1)
Sodium: 139 mmol/L (ref 135–145)

## 2022-08-03 LAB — CBC
HCT: 37.2 % (ref 36.0–46.0)
Hemoglobin: 11.8 g/dL — ABNORMAL LOW (ref 12.0–15.0)
MCH: 24.7 pg — ABNORMAL LOW (ref 26.0–34.0)
MCHC: 31.7 g/dL (ref 30.0–36.0)
MCV: 78 fL — ABNORMAL LOW (ref 80.0–100.0)
Platelets: 266 10*3/uL (ref 150–400)
RBC: 4.77 MIL/uL (ref 3.87–5.11)
RDW: 16.4 % — ABNORMAL HIGH (ref 11.5–15.5)
WBC: 9.9 10*3/uL (ref 4.0–10.5)
nRBC: 0 % (ref 0.0–0.2)

## 2022-08-03 NOTE — ED Triage Notes (Addendum)
Pt to ED via POV from [facility]. Pt reports BP was 168 systolic at [facility]. Pt states no hx HTN or daily medication. Pt also endorses HA and states her right hand was numb yesterday. Pt denies CP, Sob or blurry vision.

## 2022-10-08 IMAGING — CR DG KNEE COMPLETE 4+V*R*
4 series · 4 of 4 positions shown · non-contrast
Comparison: April 01, 2020

CLINICAL DATA: Pain

EXAM:
RIGHT KNEE - COMPLETE 4+ VIEW

[knee ap]
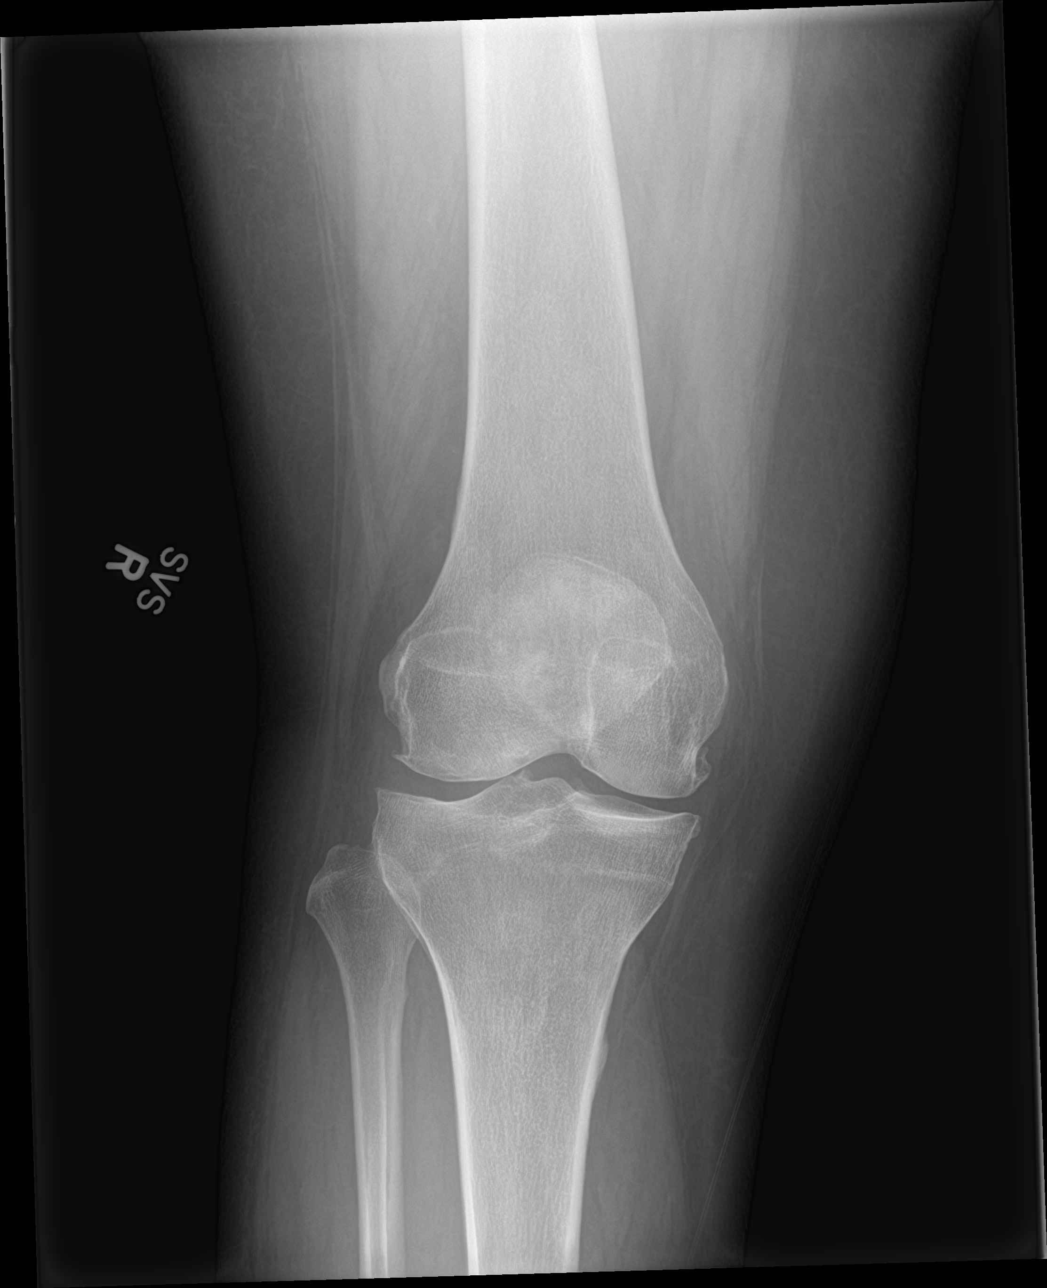

[knee obl (1 of 2)]
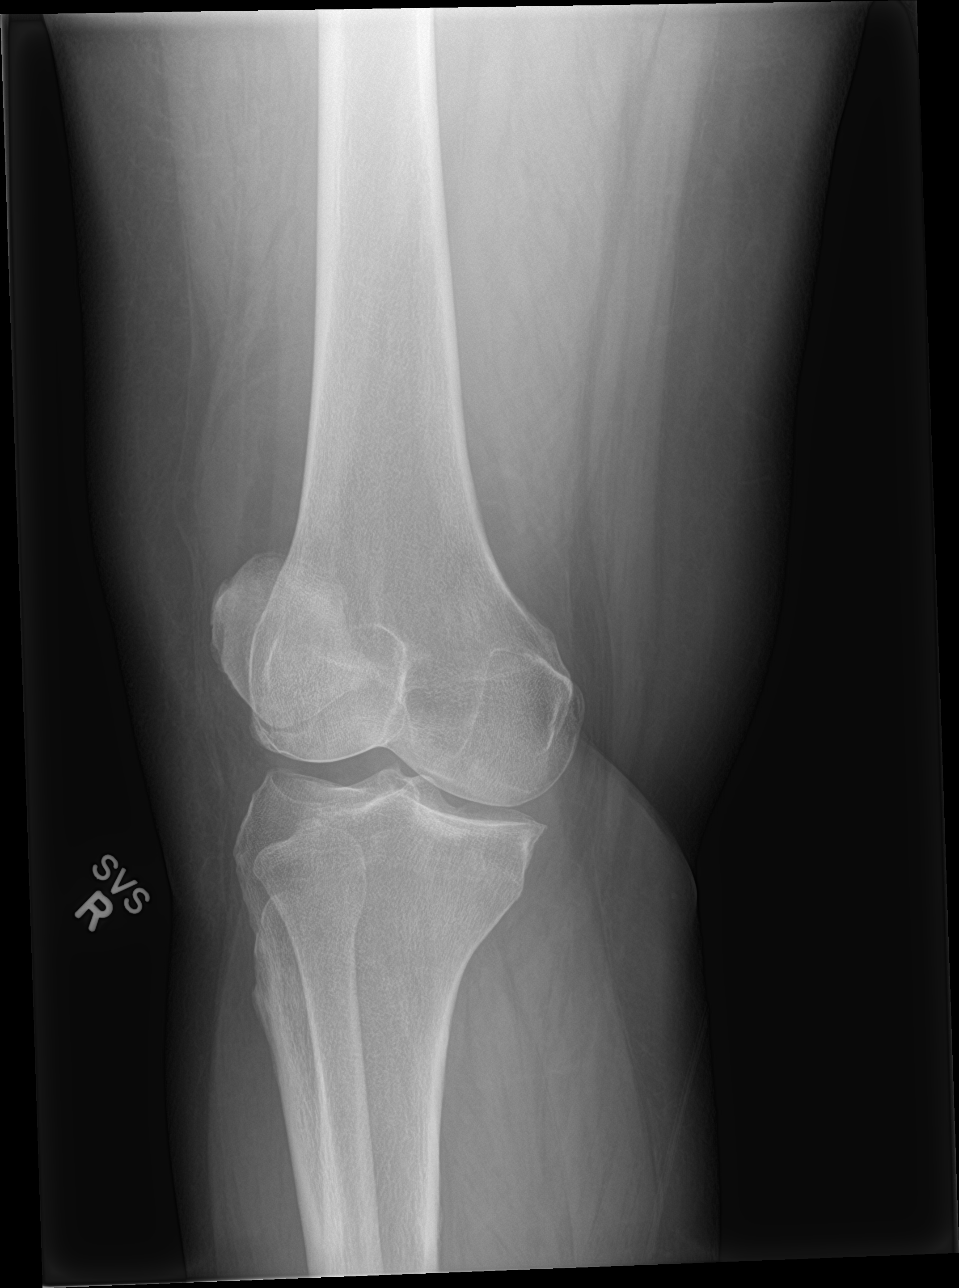

[knee obl (2 of 2)]
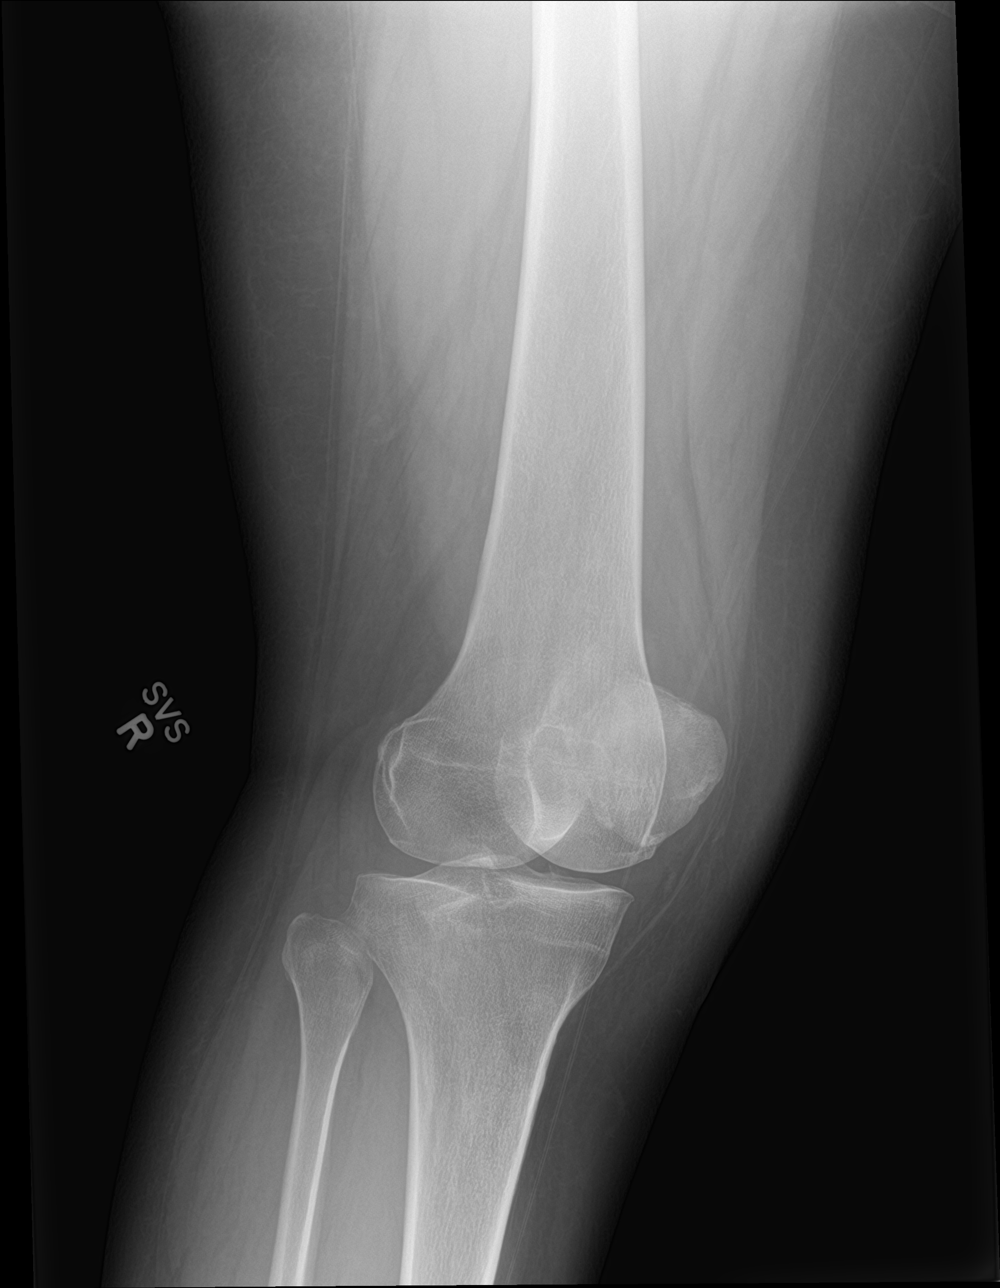

[knee lat]
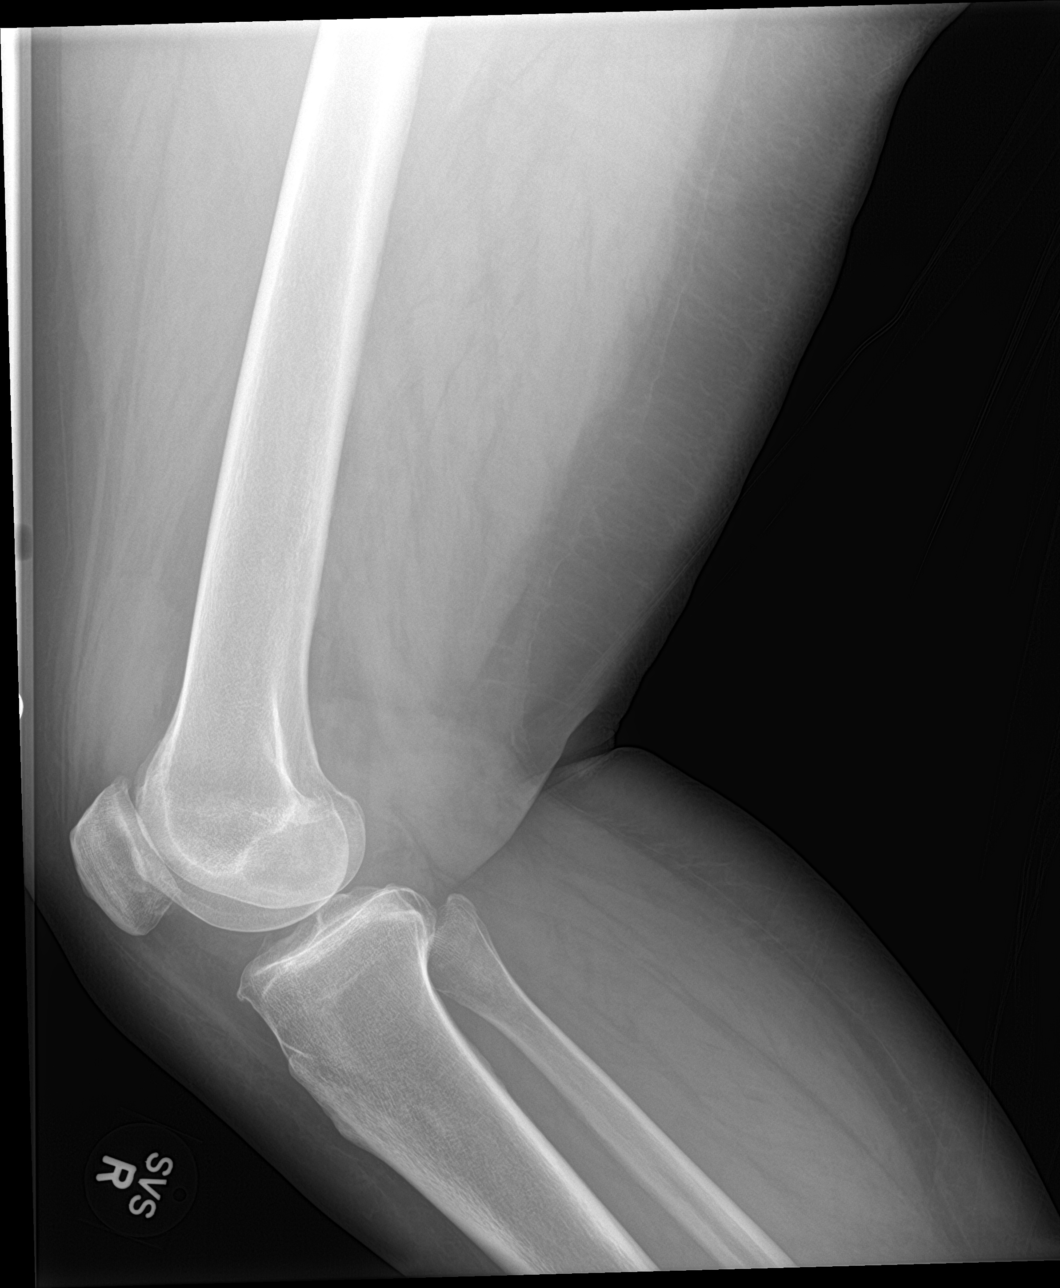

[4 of 4 positions shown; findings below may reference images not displayed]

FINDINGS: Frontal, lateral, and bilateral oblique views were obtained. No
evident fracture or dislocation. There is a small joint effusion.
There is moderate narrowing of the patellofemoral joint. Other joint
spaces appear unremarkable. There is spurring in all compartments.
No erosive change
IMPRESSION: Osteoarthritic change, most notably in the patellofemoral joint.
Small joint effusion. No fracture or dislocation.

## 2023-01-12 ENCOUNTER — Ambulatory Visit
Admission: EM | Admit: 2023-01-12 | Discharge: 2023-01-12 | Disposition: A | Discharge status: Home / Self Care | Payer: Medicaid Other | Attending: Family Medicine | Admitting: Family Medicine

## 2023-01-12 DIAGNOSIS — M25879 Other specified joint disorders, unspecified ankle and foot: Secondary | ICD-10-CM | POA: Diagnosis not present

## 2023-01-12 MED ORDER — NAPROXEN 500 MG PO TABS: 500.0000 mg | ORAL_TABLET | Freq: Two times a day (BID) | ORAL | 0 refills | Status: DC

## 2023-01-12 MED ORDER — PREDNISONE 10 MG (21) PO TBPK: ORAL_TABLET | Freq: Every day | ORAL | 0 refills | Status: DC

## 2023-01-12 NOTE — ED Provider Notes (Signed)
[facility]    CSN: [id number] Arrival date & time: 01/12/23  1222      History   Chief Complaint Chief Complaint  Patient presents with   Foot Pain    HPI  HPI Deanna Arellano is a 52 y.o. female.   Deanna Arellano presents for bilateral foot pain for the past 7 days. Has soreness and tightness on the bottom of the feet.  While at work, she had itching on the bottom of her feet.  She soaked them last night in esposom salt. She wears bubble slides and not tennis shoes. Her feet are swollen. She was not able to put on shoes today. She has osteoarthritis in her knees.     History reviewed. No pertinent past medical history.  There are no problems to display for this patient.   Past Surgical History:  Procedure Laterality Date   ABDOMINAL HYSTERECTOMY      OB History   No obstetric history on file.      Home Medications    Prior to Admission medications   Medication Sig Start Date End Date Taking? Authorizing Provider  naproxen (NAPROSYN) 500 MG tablet Take 1 tablet (500 mg total) by mouth 2 (two) times daily with a meal. 01/12/23  Yes Shatina Streets, DO  predniSONE (STERAPRED UNI-PAK 21 TAB) 10 MG (21) TBPK tablet Take by mouth daily. Take 6 tabs by mouth daily for 1, then 5 tabs for 1 day, then 4 tabs for 1 day, then 3 tabs for 1 day, then 2 tabs for 1 day, then 1 tab for 1 day. 01/12/23  Yes Hermina Barnard, DO  fluticasone (FLONASE) 50 MCG/ACT nasal spray Place 1 spray into both nostrils daily. 10/03/18   Renford Dills, NP    Family History Family History  Problem Relation Age of Onset   Asthma Mother    Hypertension Father     Social History Social History   Tobacco Use   Smoking status: Never   Smokeless tobacco: Never  Vaping Use   Vaping Use: Never used  Substance Use Topics   Alcohol use: Yes   Drug use: No     Allergies   Amoxicillin   Review of Systems Review of Systems: :negative unless otherwise stated in HPI.      Physical  Exam Triage Vital Signs ED Triage Vitals [01/12/23 1236]  Enc Vitals Group     BP      Pulse      Resp      Temp      Temp src      SpO2      Weight 265 lb (120.2 kg)     Height 5\' 7"  (1.702 m)     Head Circumference      Peak Flow      Pain Score 6     Pain Loc      Pain Edu?      Excl. in GC?    No data found.  Updated Vital Signs BP (!) 144/85 (BP Location: Left Arm)   Pulse 77   Temp 98.3 F (36.8 C) (Oral)   Ht 5\' 7"  (1.702 m)   Wt 120.2 kg   SpO2 97%   BMI 41.50 kg/m   Visual Acuity Right Eye Distance:   Left Eye Distance:   Bilateral Distance:    Right Eye Near:   Left Eye Near:    Bilateral Near:     Physical Exam GEN: well appearing female in no  acute distress  CVS: well perfused  RESP: speaking in full sentences without pause, no respiratory distress  MSK: Ankle/Foot, bilateral: TTP noted posterior great toe at the level of the sesamoid bones. No visible erythema, swelling, ecchymosis, or bony deformity. No notable pes planus/cavus deformity. Transverse arch grossly intact; No evidence of tibiotalar deviation; Range of motion is full in all directions. Strength is 5/5 in all directions. No tenderness at the insertion/body/myotendinous junction of the Achilles tendon; No tenderness on posterior aspects of lateral and medial malleolus; Talar dome non-tender; Unremarkable calcaneal squeeze; No plantar calcaneal tenderness; No tenderness over the navicular prominence; No tenderness over cuboid; No pain at base of 5th MT; No tenderness at the distal 2nd-5th metatarsals;  Able to walk 4 steps.     UC Treatments / Results  Labs (all labs ordered are listed, but only abnormal results are displayed) Labs Reviewed - No data to display  EKG   Radiology No results found.    Procedures Procedures (including critical care time)  Medications Ordered in UC Medications - No data to display  Initial Impression / Assessment and Plan / UC Course  I have  reviewed the triage vital signs and the nursing notes.  Pertinent labs & imaging results that were available during my care of the patient were reviewed by me and considered in my medical decision making (see chart for details).      Pt is a 52 y.o.  female with bilateral foot pain.  On exam, pt has tenderness underneath bilateral great toes concerning for sesamoiditis.   Imaging deferred and pt agrees.    Patient to gradually return to normal activities, as tolerated and continue ordinary activities within the limits permitted by pain. Prescribed prednisone taper and Naproxen sodium for pain relief.  Tylenol PRN. Advised patient to avoid OTC NSAIDs while taking prescription NSAID.  Patient to follow up with orthopedic provider, if symptoms do not improve with conservative treatment.  Return and ED precautions given. Understanding voiced. Discussed MDM, treatment plan and plan for follow-up with patient who agrees with plan.   Final Clinical Impressions(s) / UC Diagnoses   Final diagnoses:  Sesamoiditis of foot, unspecified laterality     Discharge Instructions      Stop with the pharmacy to pick up your prescriptions.  Take these medications with food to prevent stomach upset.     ED Prescriptions     Medication Sig Dispense Auth. Provider   predniSONE (STERAPRED UNI-PAK 21 TAB) 10 MG (21) TBPK tablet Take by mouth daily. Take 6 tabs by mouth daily for 1, then 5 tabs for 1 day, then 4 tabs for 1 day, then 3 tabs for 1 day, then 2 tabs for 1 day, then 1 tab for 1 day. 21 tablet Jsean Taussig, DO   naproxen (NAPROSYN) 500 MG tablet Take 1 tablet (500 mg total) by mouth 2 (two) times daily with a meal. 30 tablet Ellanie Oppedisano, DO      PDMP not reviewed this encounter.   Katha Cabal, DO 01/24/23 0100

## 2023-01-12 NOTE — Discharge Instructions (Signed)
Stop with the pharmacy to pick up your prescriptions.  Take these medications with food to prevent stomach upset.

## 2023-01-12 NOTE — ED Triage Notes (Signed)
Pt c/o bilateral foot and ankle swelling x1week  Pt states that she stands a lot and her feet have been swelling every day after work.  Pt states that last night her feet were itching.   Pt has arthritis in her knees.   Pt denies any new activities or change of walking habits. Pt has been wearing bubble shoes at work for comfort.

## 2024-07-25 DIAGNOSIS — E119 Type 2 diabetes mellitus without complications: Secondary | ICD-10-CM | POA: Diagnosis not present

## 2024-09-23 ENCOUNTER — Ambulatory Visit
Admission: EM | Admit: 2024-09-23 | Discharge: 2024-09-23 | Disposition: A | Discharge status: Home / Self Care | Attending: Physician Assistant | Admitting: Physician Assistant

## 2024-09-23 DIAGNOSIS — J029 Acute pharyngitis, unspecified: Secondary | ICD-10-CM

## 2024-09-23 DIAGNOSIS — R5383 Other fatigue: Secondary | ICD-10-CM | POA: Diagnosis not present

## 2024-09-23 DIAGNOSIS — J101 Influenza due to other identified influenza virus with other respiratory manifestations: Secondary | ICD-10-CM

## 2024-09-23 LAB — POCT INFLUENZA A/B
Influenza A, POC: POSITIVE — AB
Influenza B, POC: NEGATIVE

## 2024-09-23 LAB — POCT RAPID STREP A (OFFICE): Rapid Strep A Screen: NEGATIVE

## 2024-09-23 MED ORDER — OSELTAMIVIR PHOSPHATE 75 MG PO CAPS: 75.0000 mg | ORAL_CAPSULE | Freq: Two times a day (BID) | ORAL | 0 refills | Status: AC

## 2024-09-23 MED ORDER — LIDOCAINE VISCOUS HCL 2 % MT SOLN: 15.0000 mL | OROMUCOSAL | 0 refills | Status: AC | PRN

## 2024-09-23 NOTE — ED Triage Notes (Addendum)
 Sx x 2 days  Sore throat Bodyaches

## 2024-09-23 NOTE — ED Provider Notes (Signed)
 " [facility]    CSN: [id number] Arrival date & time: 09/23/24  1338      History   Chief Complaint Chief Complaint  Patient presents with   Sore Throat    HPI Deanna Arellano is a 53 y.o. female presenting for fever, fatigue, congestion, sore throat and body aches x 2 days. Denies cough, ear pain, sinus pain, chest pain, wheezing, shortness of breath, abdominal pain, vomiting or diarrhea.  Patient has been taking over-the-counter meds. No other complaints.   HPI  History reviewed. No pertinent past medical history.  There are no active problems to display for this patient.   Past Surgical History:  Procedure Laterality Date   ABDOMINAL HYSTERECTOMY      OB History   No obstetric history on file.      Home Medications    Prior to Admission medications  Medication Sig Start Date End Date Taking? Authorizing Provider  lidocaine  (XYLOCAINE ) 2 % solution Use as directed 15 mLs in the mouth or throat every 3 (three) hours as needed for mouth pain (swish and spit). 09/23/24  Yes Arvis Jolan NOVAK, PA-C  oseltamivir (TAMIFLU) 75 MG capsule Take 1 capsule (75 mg total) by mouth every 12 (twelve) hours for 5 days. 09/23/24 09/28/24 Yes Arvis Jolan NOVAK, PA-C    Family History Family History  Problem Relation Age of Onset   Asthma Mother    Hypertension Father     Social History Social History[1]   Allergies   Amoxicillin   Review of Systems Review of Systems  Constitutional:  Positive for fatigue and fever. Negative for chills and diaphoresis.  HENT:  Positive for congestion, rhinorrhea and sore throat. Negative for ear pain, sinus pressure and sinus pain.   Respiratory:  Negative for cough and shortness of breath.   Cardiovascular:  Negative for chest pain.  Gastrointestinal:  Negative for abdominal pain, nausea and vomiting.  Musculoskeletal:  Positive for myalgias.  Skin:  Negative for rash.  Neurological:  Positive for headaches. Negative for  weakness.  Hematological:  Negative for adenopathy.     Physical Exam Triage Vital Signs ED Triage Vitals  Encounter Vitals Group     BP      Girls Systolic BP Percentile      Girls Diastolic BP Percentile      Boys Systolic BP Percentile      Boys Diastolic BP Percentile      Pulse      Resp      Temp      Temp src      SpO2      Weight      Height      Head Circumference      Peak Flow      Pain Score      Pain Loc      Pain Education      Exclude from Growth Chart    No data found.  Updated Vital Signs BP (!) 152/90 (BP Location: Right Arm)   Pulse 81   Temp 98.2 F (36.8 C) (Oral)   Resp 19   Wt 250 lb (113.4 kg)   SpO2 97%   BMI 39.16 kg/m   Physical Exam Vitals and nursing note reviewed.  Constitutional:      General: She is not in acute distress.    Appearance: Normal appearance. She is well-developed. She is ill-appearing. She is not toxic-appearing.  HENT:     Head: Normocephalic and atraumatic.  Nose: Congestion present.     Mouth/Throat:     Mouth: Mucous membranes are moist.     Pharynx: Oropharynx is clear. Posterior oropharyngeal erythema present.  Eyes:     General: No scleral icterus.       Right eye: No discharge.        Left eye: No discharge.     Conjunctiva/sclera: Conjunctivae normal.  Cardiovascular:     Rate and Rhythm: Normal rate and regular rhythm.     Heart sounds: Normal heart sounds.  Pulmonary:     Effort: Pulmonary effort is normal. No respiratory distress.     Breath sounds: Normal breath sounds.  Musculoskeletal:     Cervical back: Neck supple.  Skin:    General: Skin is dry.  Neurological:     General: No focal deficit present.     Mental Status: She is alert. Mental status is at baseline.     Motor: No weakness.     Gait: Gait normal.  Psychiatric:        Mood and Affect: Mood normal.        Behavior: Behavior normal.        Thought Content: Thought content normal.      UC Treatments / Results   Labs (all labs ordered are listed, but only abnormal results are displayed) Labs Reviewed  POCT INFLUENZA A/B - Abnormal; Notable for the following components:      Result Value   Influenza A, POC Positive (*)    All other components within normal limits  POCT RAPID STREP A (OFFICE) - Normal    EKG   Radiology No results found.  Procedures Procedures (including critical care time)  Medications Ordered in UC Medications - No data to display  Initial Impression / Assessment and Plan / UC Course  I have reviewed the triage vital signs and the nursing notes.  Pertinent labs & imaging results that were available during my care of the patient were reviewed by me and considered in my medical decision making (see chart for details).  53 year old female presents for a 2-day history of fatigue, body aches, congestion and sore throat.  Rapid strep negative.  Rapid flu positive for flu A.  Reviewed results of patient.  Reviewed current CDC guidance, isolation protocol and ED precautions for flu.  Supportive care encouraged with increasing rest and fluids.  Sent viscous lidocaine  and Tamiflu.  Work note given.   Final Clinical Impressions(s) / UC Diagnoses   Final diagnoses:  Influenza A  Sore throat  Other fatigue     Discharge Instructions      - Flu is positive.  You are within the window for treatment Tamiflu to potentially be helpful so I sent it to the pharmacy if you are positive. - Sent viscous lidocaine  and Tamiflu. - You need to isolate until you are fever free for 24 hours and symptoms are improving. - Increase rest and fluids. - You should be seen again if you have uncontrolled fever, weakness or worsening breathing problem.      ED Prescriptions     Medication Sig Dispense Auth. Provider   lidocaine  (XYLOCAINE ) 2 % solution Use as directed 15 mLs in the mouth or throat every 3 (three) hours as needed for mouth pain (swish and spit). 100 mL Arvis Huxley  B, PA-C   oseltamivir (TAMIFLU) 75 MG capsule Take 1 capsule (75 mg total) by mouth every 12 (twelve) hours for 5 days. 10 capsule Arvis Huxley NOVAK,  PA-C      PDMP not reviewed this encounter.     [1]  Social History Tobacco Use   Smoking status: Never   Smokeless tobacco: Never  Vaping Use   Vaping status: Never Used  Substance Use Topics   Alcohol use: Yes   Drug use: No     Arvis Jolan NOVAK, PA-C 09/23/24 1552  "

## 2024-09-23 NOTE — Discharge Instructions (Addendum)
-   Flu is positive.  You are within the window for treatment Tamiflu to potentially be helpful so I sent it to the pharmacy if you are positive. - Sent viscous lidocaine  and Tamiflu. - You need to isolate until you are fever free for 24 hours and symptoms are improving. - Increase rest and fluids. - You should be seen again if you have uncontrolled fever, weakness or worsening breathing problem.
